# Patient Record
Sex: Female | Born: 1985 | Race: Black or African American | Hispanic: No | Marital: Single | Smoking: Current every day smoker
Health system: Southern US, Community
[De-identification: ages and names within clinical notes are randomized; demographics above are authoritative.]

## PROBLEM LIST (undated history)

## (undated) DIAGNOSIS — R52 Pain, unspecified: Secondary | ICD-10-CM

## (undated) DIAGNOSIS — F419 Anxiety disorder, unspecified: Secondary | ICD-10-CM

## (undated) DIAGNOSIS — M1990 Unspecified osteoarthritis, unspecified site: Secondary | ICD-10-CM

## (undated) DIAGNOSIS — R7303 Prediabetes: Secondary | ICD-10-CM

## (undated) DIAGNOSIS — K589 Irritable bowel syndrome without diarrhea: Secondary | ICD-10-CM

## (undated) DIAGNOSIS — K219 Gastro-esophageal reflux disease without esophagitis: Secondary | ICD-10-CM

## (undated) DIAGNOSIS — E010 Iodine-deficiency related diffuse (endemic) goiter: Secondary | ICD-10-CM

## (undated) DIAGNOSIS — E282 Polycystic ovarian syndrome: Secondary | ICD-10-CM

## (undated) DIAGNOSIS — S0300XA Dislocation of jaw, unspecified side, initial encounter: Secondary | ICD-10-CM

## (undated) HISTORY — PX: OSTEOTOMY FEMUR / SHAFT / SUPRACONDYLAR W/ FIXATION: SUR977

## (undated) HISTORY — PX: FOOT SURGERY: SHX648

## (undated) HISTORY — PX: BRONCHOSCOPY: SUR163

## (undated) HISTORY — PX: TUBAL LIGATION: SHX77

## (undated) HISTORY — PX: COLONOSCOPY: SHX174

---
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
## 2007-03-10 ENCOUNTER — Emergency Department: Payer: Self-pay | Admitting: Emergency Medicine

## 2007-03-10 ENCOUNTER — Other Ambulatory Visit: Payer: Self-pay

## 2007-08-02 ENCOUNTER — Observation Stay: Payer: Self-pay | Admitting: Obstetrics & Gynecology

## 2007-09-06 ENCOUNTER — Observation Stay: Payer: Self-pay | Admitting: Obstetrics & Gynecology

## 2007-09-16 ENCOUNTER — Ambulatory Visit: Payer: Self-pay | Admitting: Unknown Physician Specialty

## 2007-09-17 ENCOUNTER — Inpatient Hospital Stay: Payer: Self-pay | Admitting: Unknown Physician Specialty

## 2010-10-03 ENCOUNTER — Emergency Department: Payer: Self-pay | Admitting: Emergency Medicine

## 2011-08-28 ENCOUNTER — Ambulatory Visit: Payer: Self-pay

## 2012-02-09 ENCOUNTER — Emergency Department: Payer: Self-pay | Admitting: Emergency Medicine

## 2012-10-15 ENCOUNTER — Emergency Department: Payer: Self-pay | Admitting: Emergency Medicine

## 2013-08-02 ENCOUNTER — Ambulatory Visit: Payer: Self-pay | Admitting: Family Medicine

## 2013-09-06 ENCOUNTER — Ambulatory Visit: Payer: Self-pay | Admitting: Podiatry

## 2013-09-06 LAB — CBC WITH DIFFERENTIAL/PLATELET
BASOS ABS: 0 10*3/uL (ref 0.0–0.1)
BASOS PCT: 0.4 %
Eosinophil #: 0.2 10*3/uL (ref 0.0–0.7)
Eosinophil %: 2.3 %
HCT: 39.3 % (ref 35.0–47.0)
HGB: 13 g/dL (ref 12.0–16.0)
Lymphocyte #: 2.5 10*3/uL (ref 1.0–3.6)
Lymphocyte %: 30.4 %
MCH: 30 pg (ref 26.0–34.0)
MCHC: 33 g/dL (ref 32.0–36.0)
MCV: 91 fL (ref 80–100)
MONO ABS: 0.4 x10 3/mm (ref 0.2–0.9)
Monocyte %: 5.4 %
NEUTROS ABS: 5 10*3/uL (ref 1.4–6.5)
Neutrophil %: 61.5 %
Platelet: 213 10*3/uL (ref 150–440)
RBC: 4.32 10*6/uL (ref 3.80–5.20)
RDW: 13 % (ref 11.5–14.5)
WBC: 8.1 10*3/uL (ref 3.6–11.0)

## 2013-09-10 ENCOUNTER — Ambulatory Visit: Payer: Self-pay | Admitting: Podiatry

## 2014-09-10 NOTE — Op Note (Signed)
PATIENT NAME:  Susan Clay, Susan Clay MR#:  [id number] DATE OF BIRTH:  1985/10/07  DATE OF PROCEDURE:  09/10/2013  PREOPERATIVE DIAGNOSIS: Hallux valgus deformity, left foot.   PROCEDURE: Hallux valgus deformity, left foot.  PROCEDURE: Hallux valgus correction, Austin osteotomy,  left foot.   SURGEON: Linus Galas, DPM.   ANESTHESIA: LMA.   HEMOSTASIS: Pneumatic tourniquet, left ankle, 250 mmHg.   ESTIMATED BLOOD LOSS: Minimal.   MATERIALS: One 2.2 Medartis cannulated screw, 22 mm in length.   PATHOLOGY: None.   COMPLICATIONS: None apparent.   OPERATIVE INDICATIONS: This is a 29 year old female with a chronic history of bunion pain in both feet. The patient elects to have surgical correction starting with the left.   OPERATIVE PROCEDURE: The patient was taken to the operating room and placed on the table in the supine position. Following satisfactory LMA anesthesia, the left foot was anesthetized with 10 mL of 0.5% Sensorcaine plain around the first metatarsal. A pneumatic tourniquet was applied at the level of the left ankle and the foot was prepped and draped in the usual sterile fashion. The foot was exsanguinated and the tourniquet inflated to 250 mmHg.   Attention was then directed to the dorsal aspect of the left foot where an approximate 5 cm linear incision was made coursing proximal to distal centered over the first metatarsal and metatarsophalangeal joint. The incision was deepened via sharp and blunt dissection down to the level of the capsule where a linear capsulotomy was performed. Capsular and periosteal tissues were reflected off of the medial and dorsal aspect of the first metatarsal. Using a pneumatic saw, the medial eminence was resected along with the dorsal prominence. A 0.045 inch K wire was then driven from medial to lateral as an axis guide to the metatarsal head and a V-shaped osteotomy was performed through the head of the first metatarsal. The pin was removed and the  capital fragment was freely mobilized.   Attention was then directed to the lateral aspect of the joint in the first interspace where the incision was deepened down to the level of the transverse intermetatarsal ligament, which was incised. The adductor tendon was freed off the lateral aspect of the joint and a small section was removed. A lateral capsulotomy was performed with freeing of the sesamoid apparatus. There was noted to be good release of the contracture on the great toe. Attention was then directed back medially where the capital fragment was mobilized laterally and fixated using a K wire from the Medartis screw set. Intraoperative FluoroScan views revealed good reduction of the deformity. Next, the hole was drilled using standard technique and a 22 mm 2.2 Medartis cannulated screw was then inserted. Excellent compression and stability at the osteotomy was noted. The redundant medial eminence was then resected using a pneumatic saw and the edges were rasped smooth. Intraoperative FluoroScan views revealed good reduction of the deformity and screw placement. The wound was then flushed with copious amounts of sterile saline and closed using 4-0 Vicryl running suture for all layers from capsular and periosteal closure to deep and superficial subcutaneous and skin closure. Tincture of benzoin and Steri-Strips were applied followed by a sterile bandage. A plaster posterior splint was then applied with the foot 90 degrees relative to the leg. The patient tolerated the procedure and anesthesia well and was transported to the PACU with vital signs stable and in good condition.   ____________________________ Linus Galas, DPM tc:aw D: 09/10/2013 09:38:52 ET T: 09/10/2013 10:27:15 ET JOB#: [id number]  cc: Linus Galasodd Jennise Both, DPM, <Dictator> Lashanna Angelo DPM ELECTRONICALLY SIGNED 09/28/2013 15:58

## 2017-10-03 ENCOUNTER — Emergency Department: Payer: 59

## 2017-10-03 ENCOUNTER — Emergency Department
Admission: EM | Admit: 2017-10-03 | Discharge: 2017-10-03 | Disposition: A | Discharge status: Home / Self Care | Payer: 59 | Attending: Emergency Medicine | Admitting: Emergency Medicine

## 2017-10-03 DIAGNOSIS — Z79899 Other long term (current) drug therapy: Secondary | ICD-10-CM | POA: Diagnosis not present

## 2017-10-03 DIAGNOSIS — K8020 Calculus of gallbladder without cholecystitis without obstruction: Secondary | ICD-10-CM | POA: Insufficient documentation

## 2017-10-03 DIAGNOSIS — R1011 Right upper quadrant pain: Secondary | ICD-10-CM | POA: Insufficient documentation

## 2017-10-03 DIAGNOSIS — M545 Low back pain: Secondary | ICD-10-CM | POA: Diagnosis present

## 2017-10-03 DIAGNOSIS — R52 Pain, unspecified: Secondary | ICD-10-CM

## 2017-10-03 DIAGNOSIS — F17200 Nicotine dependence, unspecified, uncomplicated: Secondary | ICD-10-CM | POA: Diagnosis not present

## 2017-10-03 DIAGNOSIS — K8050 Calculus of bile duct without cholangitis or cholecystitis without obstruction: Secondary | ICD-10-CM

## 2017-10-03 LAB — BASIC METABOLIC PANEL
ANION GAP: 8 (ref 5–15)
BUN: 19 mg/dL (ref 6–20)
CALCIUM: 9.1 mg/dL (ref 8.9–10.3)
CO2: 24 mmol/L (ref 22–32)
Chloride: 103 mmol/L (ref 101–111)
Creatinine, Ser: 0.81 mg/dL (ref 0.44–1.00)
GFR calc Af Amer: >60 mL/min (ref >60)
Glucose, Bld: 158 mg/dL — ABNORMAL HIGH (ref 65–99)
Potassium: 3.4 mmol/L — ABNORMAL LOW (ref 3.5–5.1)
Sodium: 135 mmol/L (ref 135–145)

## 2017-10-03 LAB — CBC
HCT: 36.6 % (ref 35.0–47.0)
HEMOGLOBIN: 12.7 g/dL (ref 12.0–16.0)
MCH: 31.2 pg (ref 26.0–34.0)
MCHC: 34.7 g/dL (ref 32.0–36.0)
MCV: 89.9 fL (ref 80.0–100.0)
Platelets: 268 10*3/uL (ref 150–440)
RBC: 4.07 MIL/uL (ref 3.80–5.20)
RDW: 13.2 % (ref 11.5–14.5)
WBC: 14.8 10*3/uL — ABNORMAL HIGH (ref 3.6–11.0)

## 2017-10-03 LAB — HEPATIC FUNCTION PANEL
ALK PHOS: 77 U/L (ref 38–126)
ALT: 16 U/L (ref 14–54)
AST: 34 U/L (ref 15–41)
Albumin: 4 g/dL (ref 3.5–5.0)
BILIRUBIN TOTAL: 0.4 mg/dL (ref 0.3–1.2)
Total Protein: 7.6 g/dL (ref 6.5–8.1)

## 2017-10-03 LAB — TROPONIN I

## 2017-10-03 LAB — HCG, QUANTITATIVE, PREGNANCY

## 2017-10-03 LAB — LIPASE, BLOOD: Lipase: 31 U/L (ref 11–51)

## 2017-10-03 MED ORDER — HYDROCODONE-ACETAMINOPHEN 5-325 MG PO TABS: 1.0000 | ORAL_TABLET | Freq: Four times a day (QID) | ORAL | 0 refills | Status: DC | PRN

## 2017-10-03 MED ORDER — ACYCLOVIR 200 MG PO CAPS
800.0000 mg | ORAL_CAPSULE | Freq: Once | ORAL | Status: AC
  Administered 2017-10-03: 800 mg via ORAL
  Filled 2017-10-03: qty 4

## 2017-10-03 MED ORDER — ONDANSETRON HCL 4 MG/2ML IJ SOLN
4.0000 mg | Freq: Once | INTRAMUSCULAR | Status: AC
  Administered 2017-10-03: 4 mg via INTRAVENOUS
  Filled 2017-10-03: qty 2

## 2017-10-03 MED ORDER — MORPHINE SULFATE (PF) 4 MG/ML IV SOLN
4.0000 mg | Freq: Once | INTRAVENOUS | Status: AC
  Administered 2017-10-03: 4 mg via INTRAVENOUS
  Filled 2017-10-03: qty 1

## 2017-10-03 MED ORDER — SODIUM CHLORIDE 0.9 % IV BOLUS
1000.0000 mL | Freq: Once | INTRAVENOUS | Status: AC
  Administered 2017-10-03: 1000 mL via INTRAVENOUS

## 2017-10-03 MED ORDER — ONDANSETRON 4 MG PO TBDP: 4.0000 mg | ORAL_TABLET | Freq: Three times a day (TID) | ORAL | 0 refills | Status: DC | PRN

## 2017-10-03 MED ORDER — KETOROLAC TROMETHAMINE 30 MG/ML IJ SOLN
15.0000 mg | Freq: Once | INTRAMUSCULAR | Status: AC
  Administered 2017-10-03: 15 mg via INTRAVENOUS
  Filled 2017-10-03: qty 1

## 2017-10-03 MED ORDER — IBUPROFEN 600 MG PO TABS: 600.0000 mg | ORAL_TABLET | Freq: Three times a day (TID) | ORAL | 0 refills | Status: DC | PRN

## 2017-10-03 NOTE — Discharge Instructions (Signed)
It was a pleasure to take care of you today, and thank you for coming to our emergency department.  If you have any questions or concerns before leaving please ask the nurse to grab me and I'm more than happy to go through your aftercare instructions again.  If you were prescribed any opioid pain medication today such as Norco, Vicodin, Percocet, morphine, hydrocodone, or oxycodone please make sure you do not drive when you are taking this medication as it can alter your ability to drive safely.  If you have any concerns once you are home that you are not improving or are in fact getting worse before you can make it to your follow-up appointment, please do not hesitate to call [phone] and come back for further evaluation.  Merrily Brittle, MD  Results for orders placed or performed during the hospital encounter of 10/03/17  Basic metabolic panel  Result Value Ref Range   Sodium 135 135 - 145 mmol/L   Potassium 3.4 (L) 3.5 - 5.1 mmol/L   Chloride 103 101 - 111 mmol/L   CO2 24 22 - 32 mmol/L   Glucose, Bld 158 (H) 65 - 99 mg/dL   BUN 19 6 - 20 mg/dL   Creatinine, Ser 6.96 0.44 - 1.00 mg/dL   Calcium 9.1 8.9 - 29.5 mg/dL   GFR calc non Af Amer >60 >60 mL/min   GFR calc Af Amer >60 >60 mL/min   Anion gap 8 5 - 15  CBC  Result Value Ref Range   WBC 14.8 (H) 3.6 - 11.0 K/uL   RBC 4.07 3.80 - 5.20 MIL/uL   Hemoglobin 12.7 12.0 - 16.0 g/dL   HCT 28.4 13.2 - 44.0 %   MCV 89.9 80.0 - 100.0 fL   MCH 31.2 26.0 - 34.0 pg   MCHC 34.7 32.0 - 36.0 g/dL   RDW 10.2 72.5 - 36.6 %   Platelets 268 150 - 440 K/uL  Troponin I  Result Value Ref Range   Troponin I <0.03 <0.03 ng/mL  hCG, quantitative, pregnancy  Result Value Ref Range   hCG, Beta Chain, Quant, S <1 <5 mIU/mL  Lipase, blood  Result Value Ref Range   Lipase 31 11 - 51 U/L  Hepatic function panel  Result Value Ref Range   Total Protein 7.6 6.5 - 8.1 g/dL   Albumin 4.0 3.5 - 5.0 g/dL   AST 34 15 - 41 U/L   ALT 16 14 - 54 U/L   Alkaline  Phosphatase 77 38 - 126 U/L   Total Bilirubin 0.4 0.3 - 1.2 mg/dL   Bilirubin, Direct <4.4 (L) 0.1 - 0.5 mg/dL   Indirect Bilirubin NOT CALCULATED 0.3 - 0.9 mg/dL   Dg Chest 2 View  Result Date: 10/03/2017 CLINICAL DATA:  Mid back pain EXAM: CHEST - 2 VIEW COMPARISON:  None. FINDINGS: The heart size and mediastinal contours are within normal limits. Both lungs are clear. The visualized skeletal structures are unremarkable. IMPRESSION: No active cardiopulmonary disease. Electronically Signed   By: Jasmine Pang M.D.   On: 10/03/2017 02:49   US Abdomen Limited Ruq  Result Date: 10/03/2017 CLINICAL DATA:  Acute onset of right upper quadrant abdominal pain, radiating to the back. EXAM: ULTRASOUND ABDOMEN LIMITED RIGHT UPPER QUADRANT COMPARISON:  None. FINDINGS: Gallbladder: Mobile stones are seen within the gallbladder, measuring up to 8 mm in size. No significant gallbladder wall thickening or pericholecystic fluid is seen. No ultrasonographic Murphy's sign elicited. Common bile duct: Diameter: 0.3 cm,  within normal limits in caliber. Liver: No focal lesion identified. Within normal limits in parenchymal echogenicity. Portal vein is patent on color Doppler imaging with normal direction of blood flow towards the liver. IMPRESSION: 1. No acute abnormality seen at the right upper quadrant. 2. Cholelithiasis. Gallbladder otherwise unremarkable in appearance. Electronically Signed   By: Roanna Raider M.D.   On: 10/03/2017 05:19

## 2017-10-03 NOTE — ED Notes (Signed)
Pt with severe ride sided back pain radiating around front. States it started suddenly and accompanied by nausea and vomitting. Zofran in triage gave slight relief

## 2017-10-03 NOTE — ED Provider Notes (Signed)
[facility] Emergency Department Provider Note  ____________________________________________   None    (approximate)  I have reviewed the triage vital signs and the nursing notes.   HISTORY  Chief Complaint Back Pain   HPI Susan Clay is a 32 y.o. female who comes to the emergency department via EMS with sudden onset severe right upper quadrant and right back pain that occurred while she was in bed.  Is associated with nausea but no vomiting.  No dysuria frequency or hesitancy.  No fevers or chills.  She has no history of abdominal surgeries.  The symptoms are severe and seem to be intermittent.  Somewhat worse when trying to eat.  No history of IV drug use steroids or tuberculosis.  The pain is sharp and severe radiating around from her back towards her right upper quadrant.  It is not ripping or tearing.  History reviewed. No pertinent past medical history.  There are no active problems to display for this patient.   History reviewed. No pertinent surgical history.  Prior to Admission medications   Medication Sig Start Date End Date Taking? Authorizing Provider  HYDROcodone-acetaminophen (NORCO) 5-325 MG tablet Take 1 tablet by mouth every 6 (six) hours as needed for up to 15 doses for severe pain. 10/03/17   Merrily Brittle, MD  ibuprofen (ADVIL,MOTRIN) 600 MG tablet Take 1 tablet (600 mg total) by mouth every 8 (eight) hours as needed. 10/03/17   Merrily Brittle, MD  ondansetron (ZOFRAN ODT) 4 MG disintegrating tablet Take 1 tablet (4 mg total) by mouth every 8 (eight) hours as needed for nausea or vomiting. 10/03/17   Merrily Brittle, MD    Allergies Phenylephrine hcl  No family history on file.  Social History Social History   Tobacco Use  . Smoking status: Current Every Day Smoker  . Smokeless tobacco: Never Used  Substance Use Topics  . Alcohol use: Yes  . Drug use: Not on file    Review of Systems Constitutional: No  fever/chills Eyes: No visual changes. ENT: No sore throat. Cardiovascular: Denies chest pain. Respiratory: Denies shortness of breath. Gastrointestinal: Positive for abdominal pain.  Positive for nausea, no vomiting.  No diarrhea.  No constipation. Genitourinary: Negative for dysuria. Musculoskeletal: Positive for back pain. Skin: Negative for rash. Neurological: Negative for headaches, focal weakness or numbness.   ____________________________________________   PHYSICAL EXAM:  VITAL SIGNS: ED Triage Vitals  Enc Vitals Group     BP 10/03/17 0209 109/75     Pulse Rate 10/03/17 0209 71     Resp 10/03/17 0209 19     Temp 10/03/17 0209 97.6 F (36.4 C)     Temp Source 10/03/17 0209 Oral     SpO2 10/03/17 0209 99 %     Weight 10/03/17 0210 210 lb (95.3 kg)     Height 10/03/17 0210  (1.6 m)     Head Circumference --      Peak Flow --      Pain Score 10/03/17 0209 10     Pain Loc --      Pain Edu? --      Excl. in GC? --     Constitutional: Alert and oriented x4 lying on her left side crying in pain Eyes: PERRL EOMI. Head: Atraumatic. Nose: No congestion/rhinnorhea. Mouth/Throat: No trismus Neck: No stridor.   Cardiovascular: Normal rate, regular rhythm. Grossly normal heart sounds.  Good peripheral circulation. Respiratory: Normal respiratory effort.  No retractions. Lungs CTAB and moving  good air Gastrointestinal: Obese soft nondistended she is somewhat tender in right upper quadrant although with negative Murphy's no rebound or guarding no peritonitis no McBurney's tenderness no costovertebral tenderness Musculoskeletal: No lower extremity edema   Neurologic:  Normal speech and language. No gross focal neurologic deficits are appreciated. Skin:  Skin is warm, dry and intact. No rash noted. Psychiatric: Mood and affect are normal. Speech and behavior are normal.    ____________________________________________   DIFFERENTIAL includes but not limited  to  Epidural abscess, pancreatitis, biliary colic, cholecystitis, cholangitis ____________________________________________   LABS (all labs ordered are listed, but only abnormal results are displayed)  Labs Reviewed  BASIC METABOLIC PANEL - Abnormal; Notable for the following components:      Result Value   Potassium 3.4 (*)    Glucose, Bld 158 (*)    All other components within normal limits  CBC - Abnormal; Notable for the following components:   WBC 14.8 (*)    All other components within normal limits  HEPATIC FUNCTION PANEL - Abnormal; Notable for the following components:   Bilirubin, Direct <0.1 (*)    All other components within normal limits  TROPONIN I  HCG, QUANTITATIVE, PREGNANCY  LIPASE, BLOOD  URINALYSIS, COMPLETE (UACMP) WITH MICROSCOPIC    Lab work reviewed by me with elevated white count which is nonspecific __________________________________________  EKG  ED ECG REPORT I, Merrily Brittle, the attending physician, personally viewed and interpreted this ECG.  Date: 10/03/2017 EKG Time:  Rate: 72 Rhythm: normal sinus rhythm QRS Axis: normal Intervals: normal ST/T Wave abnormalities: normal Narrative Interpretation: no evidence of acute ischemia  ____________________________________________  RADIOLOGY  Right upper quadrant ultrasound reviewed by me consistent with biliary colic and no evidence of obstruction or cholecystitis Chest x-ray reviewed by me with no acute disease ____________________________________________   PROCEDURES  Procedure(s) performed: no  Procedures  Critical Care performed: no  Observation: no ____________________________________________   INITIAL IMPRESSION / ASSESSMENT AND PLAN / ED COURSE  Pertinent labs & imaging results that were available during my care of the patient were reviewed by me and considered in my medical decision making (see chart for details).  The patient arrives quite uncomfortable appearing  with right back and right upper quadrant pain.  It is in a single band and although there is no rash do have some concern for shingles.  I will cover her with acyclovir in addition to Toradol and morphine while right upper quadrant ultrasound is pending.  ----------------------------------------- 5:49 AM on 10/03/2017 -----------------------------------------  The patient's pain is nearly completely resolved and she is able to keep food down.  Right upper quadrant ultrasound is consistent with gallstone with no evidence of cholecystitis.  I had a lengthy discussion with the patient regarding importance of follow-up with general surgery to consider outpatient cholecystectomy.  At this point the patient is medically stable for outpatient management verbalizes understanding and agreement the plan.      ____________________________________________   FINAL CLINICAL IMPRESSION(S) / ED DIAGNOSES  Final diagnoses:  Pain  Biliary colic      NEW MEDICATIONS STARTED DURING THIS VISIT:  New Prescriptions   HYDROCODONE-ACETAMINOPHEN (NORCO) 5-325 MG TABLET    Take 1 tablet by mouth every 6 (six) hours as needed for up to 15 doses for severe pain.   IBUPROFEN (ADVIL,MOTRIN) 600 MG TABLET    Take 1 tablet (600 mg total) by mouth every 8 (eight) hours as needed.   ONDANSETRON (ZOFRAN ODT) 4 MG DISINTEGRATING TABLET  Take 1 tablet (4 mg total) by mouth every 8 (eight) hours as needed for nausea or vomiting.     Note:  This document was prepared using Dragon voice recognition software and may include unintentional dictation errors.     Merrily Brittle, MD 10/03/17 [phone]

## 2017-10-03 NOTE — ED Triage Notes (Signed)
Patient coming EMS from home, with sudden onset of pain across mid back. Patient denies strenuous activity. Patient describes pain as squeezing.EMS vitals:  NSR, 124/78, HR 82, 98% on RA. Patient has not taken any pain medications.

## 2017-10-06 ENCOUNTER — Ambulatory Visit: Payer: Self-pay | Admitting: General Surgery

## 2017-10-06 NOTE — H&P (Signed)
PATIENT PROFILE: Susan Clay is a 32 y.o. female who presents to the Clinic for consultation at the request of Dr. Seymour Bars for evaluation of cholelithiasis.  PCP:  Loletha Carrow, PA  HISTORY OF PRESENT ILLNESS: Ms. Lenger reports had an episode of abdominal pain 3 days ago. The pain was severe and started on the right back and radiated to the right upper quadrant. Pain was improved with morphine in the ER. Patient still has had less severe pain that has localized only to the right upper abdomen. Pain is not associated with food intake. Pain is also improved with leaning forward and aggravated leaning backward. Denies fever. Refers chills during the bad episode.    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 Positive for cold  Respiratory ROS: negative for - cough, shortness of breath or wheezing Cardiovascular ROS: no chest pain or palpitations GI ROS: negative for nausea, vomiting, abdominal pain, diarrhea. Positive for  constipation and heartburn Musculoskeletal ROS: positive for - joint swelling or muscle pain Neurological ROS: negative for - confusion, syncope Dermatological ROS: positive for itching and hives Psychiatric: negative for anxiety, depression, difficulty sleeping and memory loss  MEDICATIONS: CurrentMedications        Current Outpatient Medications  Medication Sig Dispense Refill  . HYDROcodone-acetaminophen (NORCO) 5-325 mg tablet TK 1 T PO Q 6 H PRF SEVERE PAIN  0  . hydrOXYzine (ATARAX) 25 MG tablet Take 1 tablet (25 mg total) by mouth 3 (three) times daily as needed for Itching 30 tablet 1  . IBU 600 mg tablet TK 1 T PO Q 8 H PRN  0  . ondansetron (ZOFRAN-ODT) 4 MG disintegrating tablet TAKE ONE TABLET BY MOUTH EVERY EIGHT HOURS AS NEEDED FOR NAUSEA OR VOMITING  0  . pantoprazole (PROTONIX) 40 MG DR tablet TAKE 1 TABLET BY MOUTH ONCE DAILY 90 tablet 1  . metFORMIN (GLUCOPHAGE-XR) 500 MG XR tablet Take 1 tablet (500 mg total) by mouth 2 (two) times daily. (Patient not taking: Reported on 10/06/2017 ) 60 tablet 5  . spironolactone (ALDACTONE) 50 MG tablet Take 1 tablet (50 mg total) by mouth 2 (two) times daily (Patient not taking: Reported on 10/06/2017 ) 60 tablet 5   No current facility-administered medications for this visit.       ALLERGIES: Phenylephrine  PAST MEDICAL HISTORY:     Past Medical History:  Diagnosis Date  . Allergic state   . Arthritis   . Cigarette smoker   .  Constipation   . GERD (gastroesophageal reflux disease)   . IBS (irritable bowel syndrome)   . Osteoporosis, post-menopausal   . Other nonspecific abnormal finding    ACHD age 54 normal since then  . PCOS (polycystic ovarian syndrome)   . Thyromegaly   . 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 bunion left foot  2015  . CESAREAN SECTION  2005  . CESAREAN SECTION  2009   . OSTEOTOMY FEMORAL SHAFT/SUPRACONDYLAR    . SURVEILLANCE COLONOSCOPY N/A 02/11/2014   Procedure: SURVEILLANCE COLONOSCOPY;  Surgeon: Saintclair Halsted, MD;  Location[facility] ENDO[facility];  Service: Gastroenterology;  Laterality: N/A;  . TUBAL LIGATION  2009     FAMILY HISTORY:      Family History  Problem Relation Age of Onset  . Rickets Mother   . Pancreatic cancer Mother   . Diabetes type II Mother   . Rickets Brother   . No Known Problems Father   . Diabetes type II Maternal Grandmother   . Diabetes type II Paternal Grandmother   . Prostate cancer Maternal Grandfather   . No Known Problems Paternal Grandfather   . Rickets Son   . ADD / ADHD Son   . High blood pressure (Hypertension) Neg Hx   . Breast cancer Neg Hx   . Colon cancer Neg Hx      SOCIAL HISTORY: Social History          Socioeconomic History  . Marital status: Single    Spouse name: Not on file  . Number of children: 2  . Years of education: Not on file  . Highest education level: Not on file  Occupational History  . Not on file  Social Needs  . Financial resource strain: Not on file  . Food insecurity:    Worry: Not on file    Inability: Not on file  . Transportation needs:    Medical: Not on file    Non-medical: Not on file  Tobacco Use  . Smoking status: Current Every Day Smoker    Packs/day: 0.50    Types: Cigarettes  . Smokeless tobacco: Never Used  Substance and Sexual Activity  . Alcohol use: Yes  . Drug use: No    Comment: marijuana in the past  . Sexual activity: Yes    Partners: Male    Birth control/protection: Injection    Comment: tubal ligation  Other Topics Concern  . Would you please tell us about the people who live in your home, your pets, or anything else important to your social life? 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    In a relationship       PHYSICAL EXAM:    Vitals:   10/06/17 1556  BP: 122/83  Pulse: (!) 113  Temp: 36.9 C (98.4 F)   Body mass index is 36.95 kg/m. Weight: 91.6 kg (202 lb)   GENERAL: Alert, active, oriented x3  HEENT: Pupils equal reactive to light. Extraocular movements are intact. Sclera clear. Palpebral conjunctiva normal red color.Pharynx clear.  NECK: Supple with no palpable mass and no adenopathy.  LUNGS: Sound clear with no rales rhonchi or wheezes.  HEART: Regular rhythm S1 and S2 without murmur.  ABDOMEN: Soft and depressible, nontender with no palpable mass, no hepatomegaly.   EXTREMITIES: Well-developed well-nourished symmetrical with no dependent  edema.  NEUROLOGICAL: Awake alert oriented, facial expression symmetrical, moving all extremities.  REVIEW OF DATA: I have reviewed the following data today:      Appointment on 08/05/2017  Component Date Value  . Influenza A Antigen 08/05/2017 Negative   . Influenza B Antigen 08/05/2017 Negative      ASSESSMENT: Ms. Aerts is a 32 y.o. female presenting for consultation for cholelithiasis.    Patient was oriented about the diagnosis of cholelithiasis. Also oriented about what is the gallbladder, its anatomy and function and the implications of having stones. The patient was oriented about the treatment alternatives (observation vs cholecystectomy). Patient was oriented that a low percentage of patient will continue to have similar pain symptoms even after the gallbladder is removed. Surgical technique (open vs laparoscopic) was discussed. It was also discussed the goals of the surgery (decrease the pain episodes and avoid the risk of cholecystitis) and the risk of surgery including: bleeding, infection, common bile duct injury, stone retention, injury to other organs such as bowel, liver, stomach, other complications such as hernia, bowel obstruction among others. Also discussed with patient about anesthesia and its  complications such as: reaction to medications, pneumonia, heart complications, death, among others.  PLAN: 1. Laparoscopic cholecystectomy (16109) 2. CBC, CMP 3. Do not take aspirin 5 days before surgery  Patient verbalized understanding, all questions were answered, and were agreeable with the plan outlined above.   Carolan Shiver, MD  Electronically signed by Carolan Shiver, MD

## 2017-10-06 NOTE — H&P (View-Only) (Signed)
PATIENT PROFILE: Kerryann Abaya is a 32 y.o. female who presents to the Clinic for consultation at the request of Dr. Self for evaluation of cholelithiasis.  PCP:  McLaughlin, Miriam Klem, PA  HISTORY OF PRESENT ILLNESS: Ms. Yoshida reports had an episode of abdominal pain 3 days ago. The pain was severe and started on the right back and radiated to the right upper quadrant. Pain was improved with morphine in the ER. Patient still has had less severe pain that has localized only to the right upper abdomen. Pain is not associated with food intake. Pain is also improved with leaning forward and aggravated leaning backward. Denies fever. Refers chills during the bad episode.    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 Positive for cold  Respiratory ROS: negative for - cough, shortness of breath or wheezing Cardiovascular ROS: no chest pain or palpitations GI ROS: negative for nausea, vomiting, abdominal pain, diarrhea. Positive for  constipation and heartburn Musculoskeletal ROS: positive for - joint swelling or muscle pain Neurological ROS: negative for - confusion, syncope Dermatological ROS: positive for itching and hives Psychiatric: negative for anxiety, depression, difficulty sleeping and memory loss  MEDICATIONS: CurrentMedications        Current Outpatient Medications  Medication Sig Dispense Refill  . HYDROcodone-acetaminophen (NORCO) 5-325 mg tablet TK 1 T PO Q 6 H PRF SEVERE PAIN  0  . hydrOXYzine (ATARAX) 25 MG tablet Take 1 tablet (25 mg total) by mouth 3 (three) times daily as needed for Itching 30 tablet 1  . IBU 600 mg tablet TK 1 T PO Q 8 H PRN  0  . ondansetron (ZOFRAN-ODT) 4 MG disintegrating tablet TAKE ONE TABLET BY MOUTH EVERY EIGHT HOURS AS NEEDED FOR NAUSEA OR VOMITING  0  . pantoprazole (PROTONIX) 40 MG DR tablet TAKE 1 TABLET BY MOUTH ONCE DAILY 90 tablet 1  . metFORMIN (GLUCOPHAGE-XR) 500 MG XR tablet Take 1 tablet (500 mg total) by mouth 2 (two) times daily. (Patient not taking: Reported on 10/06/2017 ) 60 tablet 5  . spironolactone (ALDACTONE) 50 MG tablet Take 1 tablet (50 mg total) by mouth 2 (two) times daily (Patient not taking: Reported on 10/06/2017 ) 60 tablet 5   No current facility-administered medications for this visit.       ALLERGIES: Phenylephrine  PAST MEDICAL HISTORY:     Past Medical History:  Diagnosis Date  . Allergic state   . Arthritis   . Cigarette smoker   .   Constipation   . GERD (gastroesophageal reflux disease)   . IBS (irritable bowel syndrome)   . Osteoporosis, post-menopausal   . Other nonspecific abnormal finding    ACHD age 21 normal since then  . PCOS (polycystic ovarian syndrome)   . Thyromegaly   . 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 bunion left foot  2015  . CESAREAN SECTION  2005  . CESAREAN SECTION  2009   . OSTEOTOMY FEMORAL SHAFT/SUPRACONDYLAR    . SURVEILLANCE COLONOSCOPY N/A 02/11/2014   Procedure: SURVEILLANCE COLONOSCOPY;  Surgeon: Daniel Murray Wild, MD;  Location: [facility];  Service: Gastroenterology;  Laterality: N/A;  . TUBAL LIGATION  2009     FAMILY HISTORY:      Family History  Problem Relation Age of Onset  . Rickets Mother   . Pancreatic cancer Mother   . Diabetes type II Mother   . Rickets Brother   . No Known Problems Father   . Diabetes type II Maternal Grandmother   . Diabetes type II Paternal Grandmother   . Prostate cancer Maternal Grandfather   . No Known Problems Paternal Grandfather   . Rickets Son   . ADD / ADHD Son   . High blood pressure (Hypertension) Neg Hx   . Breast cancer Neg Hx   . Colon cancer Neg Hx      SOCIAL HISTORY: Social History          Socioeconomic History  . Marital status: Single    Spouse name: Not on file  . Number of children: 2  . Years of education: Not on file  . Highest education level: Not on file  Occupational History  . Not on file  Social Needs  . Financial resource strain: Not on file  . Food insecurity:    Worry: Not on file    Inability: Not on file  . Transportation needs:    Medical: Not on file    Non-medical: Not on file  Tobacco Use  . Smoking status: Current Every Day Smoker    Packs/day: 0.50    Types: Cigarettes  . Smokeless tobacco: Never Used  Substance and Sexual Activity  . Alcohol use: Yes  . Drug use: No    Comment: marijuana in the past  . Sexual activity: Yes    Partners: Male    Birth control/protection: Injection    Comment: tubal ligation  Other Topics Concern  . Would you please tell us about the people who live in your home, your pets, or anything else important to your social life? 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 Currently at [employer], C[employer] program.    In a relationship       PHYSICAL EXAM:    Vitals:   10/06/17 1556  BP: 122/83  Pulse: (!) 113  Temp: 36.9 C (98.4 F)   Body mass index is 36.95 kg/m. Weight: 91.6 kg (202 lb)   GENERAL: Alert, active, oriented x3  HEENT: Pupils equal reactive to light. Extraocular movements are intact. Sclera clear. Palpebral conjunctiva normal red color.Pharynx clear.  NECK: Supple with no palpable mass and no adenopathy.  LUNGS: Sound clear with no rales rhonchi or wheezes.  HEART: Regular rhythm S1 and S2 without murmur.  ABDOMEN: Soft and depressible, nontender with no palpable mass, no hepatomegaly.   EXTREMITIES: Well-developed well-nourished symmetrical with no dependent   edema.  NEUROLOGICAL: Awake alert oriented, facial expression symmetrical, moving all extremities.  REVIEW OF DATA: I have reviewed the following data today:      Appointment on 08/05/2017  Component Date Value  . Influenza A Antigen 08/05/2017 Negative   . Influenza B Antigen 08/05/2017 Negative      ASSESSMENT: Ms. Aerts is a 32 y.o. female presenting for consultation for cholelithiasis.    Patient was oriented about the diagnosis of cholelithiasis. Also oriented about what is the gallbladder, its anatomy and function and the implications of having stones. The patient was oriented about the treatment alternatives (observation vs cholecystectomy). Patient was oriented that a low percentage of patient will continue to have similar pain symptoms even after the gallbladder is removed. Surgical technique (open vs laparoscopic) was discussed. It was also discussed the goals of the surgery (decrease the pain episodes and avoid the risk of cholecystitis) and the risk of surgery including: bleeding, infection, common bile duct injury, stone retention, injury to other organs such as bowel, liver, stomach, other complications such as hernia, bowel obstruction among others. Also discussed with patient about anesthesia and its  complications such as: reaction to medications, pneumonia, heart complications, death, among others.  PLAN: 1. Laparoscopic cholecystectomy (16109) 2. CBC, CMP 3. Do not take aspirin 5 days before surgery  Patient verbalized understanding, all questions were answered, and were agreeable with the plan outlined above.   Carolan Shiver, MD  Electronically signed by Carolan Shiver, MD

## 2017-10-08 ENCOUNTER — Other Ambulatory Visit: Payer: Self-pay

## 2017-10-08 ENCOUNTER — Encounter
Admission: RE | Admit: 2017-10-08 | Discharge: 2017-10-08 | Disposition: A | Discharge status: Home / Self Care | Payer: 59 | Source: Ambulatory Visit | Attending: General Surgery | Admitting: General Surgery

## 2017-10-08 DIAGNOSIS — E669 Obesity, unspecified: Secondary | ICD-10-CM | POA: Diagnosis not present

## 2017-10-08 DIAGNOSIS — F17210 Nicotine dependence, cigarettes, uncomplicated: Secondary | ICD-10-CM | POA: Diagnosis not present

## 2017-10-08 DIAGNOSIS — K219 Gastro-esophageal reflux disease without esophagitis: Secondary | ICD-10-CM | POA: Diagnosis not present

## 2017-10-08 DIAGNOSIS — K812 Acute cholecystitis with chronic cholecystitis: Secondary | ICD-10-CM | POA: Diagnosis present

## 2017-10-08 DIAGNOSIS — Z6836 Body mass index (BMI) 36.0-36.9, adult: Secondary | ICD-10-CM | POA: Diagnosis not present

## 2017-10-08 DIAGNOSIS — K8012 Calculus of gallbladder with acute and chronic cholecystitis without obstruction: Secondary | ICD-10-CM | POA: Diagnosis not present

## 2017-10-08 DIAGNOSIS — K589 Irritable bowel syndrome without diarrhea: Secondary | ICD-10-CM | POA: Diagnosis not present

## 2017-10-08 DIAGNOSIS — E010 Iodine-deficiency related diffuse (endemic) goiter: Secondary | ICD-10-CM | POA: Diagnosis not present

## 2017-10-08 DIAGNOSIS — Z79899 Other long term (current) drug therapy: Secondary | ICD-10-CM | POA: Diagnosis not present

## 2017-10-08 DIAGNOSIS — E282 Polycystic ovarian syndrome: Secondary | ICD-10-CM | POA: Diagnosis not present

## 2017-10-08 HISTORY — DX: Dislocation of jaw, unspecified side, initial encounter: S03.00XA

## 2017-10-08 HISTORY — DX: Gastro-esophageal reflux disease without esophagitis: K21.9

## 2017-10-08 HISTORY — DX: Irritable bowel syndrome, unspecified: K58.9

## 2017-10-08 HISTORY — DX: Unspecified osteoarthritis, unspecified site: M19.90

## 2017-10-08 HISTORY — DX: Prediabetes: R73.03

## 2017-10-08 HISTORY — DX: Pain, unspecified: R52

## 2017-10-08 HISTORY — DX: Anxiety disorder, unspecified: F41.9

## 2017-10-08 HISTORY — DX: Polycystic ovarian syndrome: E28.2

## 2017-10-08 HISTORY — DX: Iodine-deficiency related diffuse (endemic) goiter: E01.0

## 2017-10-08 HISTORY — DX: Other disorders of phosphorus metabolism: E83.39

## 2017-10-08 LAB — PHOSPHORUS: Phosphorus: 2 mg/dL — ABNORMAL LOW (ref 2.5–4.6)

## 2017-10-08 NOTE — Patient Instructions (Signed)
Your procedure is scheduled on: 10/10/17 Report to Day Surgery. [facility]LO[facility] To find out your arrival time please call [phone] between 1PM - 3PM on  10/09/17  Remember: Instructions that are not followed completely may result in serious medical risk, up to and including death, or upon the discretion of your surgeon and anesthesiologist your surgery may need to be rescheduled.     _X__ 1. Do not eat food after midnight the night before your procedure.                 No gum chewing or hard candies. You may drink clear liquids up to 2 hours                 before you are scheduled to arrive for your surgery- DO not drink clear                 liquids within 2 hours of the start of your surgery.                 Clear Liquids include:  water, apple juice without pulp, clear carbohydrate                 drink such as Clearfast of Gartorade, Black Coffee or Tea (Do not add                 anything to coffee or tea).  __X__2.  On the morning of surgery brush your teeth with toothpaste and water, you                 may rinse your mouth with mouthwash if you wish.  Do not swallow any              toothpaste of mouthwash.     _X__ 3.  No Alcohol for 24 hours before or after surgery.   _X__ 4.  Do Not Smoke or use e-cigarettes For 24 Hours Prior to Your Surgery.                 Do not use any chewable tobacco products for at least 6 hours prior to                 surgery.  ____  5.  Bring all medications with you on the day of surgery if instructed.   _X__  6.  Notify your doctor if there is any change in your medical condition      (cold, fever, infections).     Do not wear jewelry, make-up, hairpins, clips or nail polish. Do not wear lotions, powders, or perfumes. You may wear deodorant. Do not shave 48 hours prior to surgery. Men may shave face and neck. Do not bring valuables to the hospital.    [facility] is not responsible for any belongings or  valuables.  Contacts, dentures or bridgework may not be worn into surgery. Leave your suitcase in the car. After surgery it may be brought to your room. For patients admitted to the hospital, discharge time is determined by your treatment team.   Patients discharged the day of surgery will not be allowed to drive home.   Please read over the following fact sheets that you were given:   Surgical Site Infection Prevention          ____ Take these medicines the morning of surgery with A SIP OF WATER:    1. PANTOPRAZOLE  2.   3.  4.  5.  6.  ____ Fleet Enema (as directed)   __X__ Use CHG Soap as directed  ____ Use inhalers on the day of surgery  ____ Stop metformin 2 days prior to surgery    ____ Take 1/2 of usual insulin dose the night before surgery. No insulin the morning          of surgery.   ____ Stop Coumadin/Plavix/aspirin on   _X__ Stop Anti-inflammatories on      10/08/17    ____ Stop supplements until after surgery.    ____ Bring C-Pap to the hospital.

## 2017-10-08 NOTE — Pre-Procedure Instructions (Signed)
H/P LISTS ACHD AS PROBLEM AGE 32. PATIENT DENIES ANY CARDIAC ISSUES OR PROBLEMS AGE 32. LM FOR DR CINTRON DIAZ TO FIND OUT WHAT ACHD IS.

## 2017-10-09 LAB — CALCITRIOL (1,25 DI-OH VIT D): Vit D, 1,25-Dihydroxy: 54.7 pg/mL (ref 19.9–79.3)

## 2017-10-09 MED ORDER — CEFAZOLIN SODIUM-DEXTROSE 2-4 GM/100ML-% IV SOLN
2.0000 g | INTRAVENOUS | Status: AC
  Administered 2017-10-10: 2 g via INTRAVENOUS

## 2017-10-10 ENCOUNTER — Encounter: Payer: Self-pay | Admitting: Anesthesiology

## 2017-10-10 ENCOUNTER — Ambulatory Visit: Payer: 59 | Admitting: Anesthesiology

## 2017-10-10 ENCOUNTER — Encounter
Admission: RE | Disposition: A | Discharge status: Home / Self Care | Payer: Self-pay | Source: Ambulatory Visit | Attending: General Surgery

## 2017-10-10 ENCOUNTER — Ambulatory Visit
Admission: RE | Admit: 2017-10-10 | Discharge: 2017-10-10 | Disposition: A | Discharge status: Home / Self Care | Payer: 59 | Source: Ambulatory Visit | Attending: General Surgery | Admitting: General Surgery

## 2017-10-10 ENCOUNTER — Other Ambulatory Visit: Payer: Self-pay

## 2017-10-10 DIAGNOSIS — K8012 Calculus of gallbladder with acute and chronic cholecystitis without obstruction: Secondary | ICD-10-CM | POA: Insufficient documentation

## 2017-10-10 DIAGNOSIS — K589 Irritable bowel syndrome without diarrhea: Secondary | ICD-10-CM | POA: Insufficient documentation

## 2017-10-10 DIAGNOSIS — K219 Gastro-esophageal reflux disease without esophagitis: Secondary | ICD-10-CM | POA: Insufficient documentation

## 2017-10-10 DIAGNOSIS — E669 Obesity, unspecified: Secondary | ICD-10-CM | POA: Insufficient documentation

## 2017-10-10 DIAGNOSIS — E010 Iodine-deficiency related diffuse (endemic) goiter: Secondary | ICD-10-CM | POA: Insufficient documentation

## 2017-10-10 DIAGNOSIS — E282 Polycystic ovarian syndrome: Secondary | ICD-10-CM | POA: Insufficient documentation

## 2017-10-10 DIAGNOSIS — F17210 Nicotine dependence, cigarettes, uncomplicated: Secondary | ICD-10-CM | POA: Insufficient documentation

## 2017-10-10 DIAGNOSIS — Z6836 Body mass index (BMI) 36.0-36.9, adult: Secondary | ICD-10-CM | POA: Insufficient documentation

## 2017-10-10 DIAGNOSIS — Z79899 Other long term (current) drug therapy: Secondary | ICD-10-CM | POA: Insufficient documentation

## 2017-10-10 HISTORY — PX: CHOLECYSTECTOMY: SHX55

## 2017-10-10 LAB — POCT PREGNANCY, URINE: Preg Test, Ur: NEGATIVE

## 2017-10-10 LAB — GLUCOSE, CAPILLARY: GLUCOSE-CAPILLARY: 99 mg/dL (ref 65–99)

## 2017-10-10 SURGERY — LAPAROSCOPIC CHOLECYSTECTOMY
Anesthesia: General | Wound class: Clean Contaminated

## 2017-10-10 MED ORDER — ROCURONIUM BROMIDE 50 MG/5ML IV SOLN
INTRAVENOUS | Status: AC
  Filled 2017-10-10: qty 1

## 2017-10-10 MED ORDER — ONDANSETRON HCL 4 MG/2ML IJ SOLN: 4.0000 mg | Freq: Once | INTRAMUSCULAR | Status: DC | PRN

## 2017-10-10 MED ORDER — MIDAZOLAM HCL 2 MG/2ML IJ SOLN
INTRAMUSCULAR | Status: DC | PRN
  Administered 2017-10-10: 2 mg via INTRAVENOUS

## 2017-10-10 MED ORDER — BUPIVACAINE HCL (PF) 0.5 % IJ SOLN
INTRAMUSCULAR | Status: DC | PRN
  Administered 2017-10-10: 15 mL

## 2017-10-10 MED ORDER — FENTANYL CITRATE (PF) 100 MCG/2ML IJ SOLN
INTRAMUSCULAR | Status: AC
  Administered 2017-10-10: 25 ug via INTRAVENOUS
  Filled 2017-10-10: qty 2

## 2017-10-10 MED ORDER — DEXAMETHASONE SODIUM PHOSPHATE 10 MG/ML IJ SOLN
INTRAMUSCULAR | Status: AC
  Filled 2017-10-10: qty 1

## 2017-10-10 MED ORDER — FENTANYL CITRATE (PF) 100 MCG/2ML IJ SOLN
INTRAMUSCULAR | Status: AC
  Administered 2017-10-10: 50 ug via INTRAVENOUS
  Filled 2017-10-10: qty 2

## 2017-10-10 MED ORDER — BUPIVACAINE HCL (PF) 0.5 % IJ SOLN
INTRAMUSCULAR | Status: AC
  Filled 2017-10-10: qty 30

## 2017-10-10 MED ORDER — ONDANSETRON HCL 4 MG/2ML IJ SOLN
INTRAMUSCULAR | Status: AC
  Filled 2017-10-10: qty 2

## 2017-10-10 MED ORDER — LIDOCAINE HCL (CARDIAC) PF 100 MG/5ML IV SOSY
PREFILLED_SYRINGE | INTRAVENOUS | Status: DC | PRN
  Administered 2017-10-10: 80 mg via INTRAVENOUS

## 2017-10-10 MED ORDER — SEVOFLURANE IN SOLN
RESPIRATORY_TRACT | Status: AC
  Filled 2017-10-10: qty 250

## 2017-10-10 MED ORDER — KETOROLAC TROMETHAMINE 30 MG/ML IJ SOLN
INTRAMUSCULAR | Status: AC
  Filled 2017-10-10: qty 1

## 2017-10-10 MED ORDER — FENTANYL CITRATE (PF) 100 MCG/2ML IJ SOLN
INTRAMUSCULAR | Status: DC | PRN
  Administered 2017-10-10: 25 ug via INTRAVENOUS
  Administered 2017-10-10: 100 ug via INTRAVENOUS
  Administered 2017-10-10: 50 ug via INTRAVENOUS
  Administered 2017-10-10: 100 ug via INTRAVENOUS
  Administered 2017-10-10: 50 ug via INTRAVENOUS

## 2017-10-10 MED ORDER — LACTATED RINGERS IV SOLN
INTRAVENOUS | Status: DC
  Administered 2017-10-10 (×2): via INTRAVENOUS

## 2017-10-10 MED ORDER — PROPOFOL 10 MG/ML IV BOLUS
INTRAVENOUS | Status: AC
  Filled 2017-10-10: qty 20

## 2017-10-10 MED ORDER — FENTANYL CITRATE (PF) 100 MCG/2ML IJ SOLN
50.0000 ug | Freq: Once | INTRAMUSCULAR | Status: AC
  Administered 2017-10-10: 50 ug via INTRAVENOUS

## 2017-10-10 MED ORDER — CEFAZOLIN SODIUM-DEXTROSE 2-4 GM/100ML-% IV SOLN
INTRAVENOUS | Status: AC
  Filled 2017-10-10: qty 100

## 2017-10-10 MED ORDER — DEXMEDETOMIDINE HCL 200 MCG/2ML IV SOLN
INTRAVENOUS | Status: DC | PRN
  Administered 2017-10-10 (×2): 8 ug via INTRAVENOUS

## 2017-10-10 MED ORDER — ONDANSETRON HCL 4 MG/2ML IJ SOLN
INTRAMUSCULAR | Status: DC | PRN
  Administered 2017-10-10: 4 mg via INTRAVENOUS

## 2017-10-10 MED ORDER — SUGAMMADEX SODIUM 200 MG/2ML IV SOLN
INTRAVENOUS | Status: DC | PRN
  Administered 2017-10-10: 200 mg via INTRAVENOUS

## 2017-10-10 MED ORDER — MIDAZOLAM HCL 2 MG/2ML IJ SOLN
INTRAMUSCULAR | Status: AC
  Filled 2017-10-10: qty 2

## 2017-10-10 MED ORDER — PHENYLEPHRINE HCL 10 MG/ML IJ SOLN
INTRAMUSCULAR | Status: DC | PRN
  Administered 2017-10-10: 100 ug via INTRAVENOUS

## 2017-10-10 MED ORDER — ROCURONIUM BROMIDE 100 MG/10ML IV SOLN
INTRAVENOUS | Status: DC | PRN
  Administered 2017-10-10: 50 mg via INTRAVENOUS
  Administered 2017-10-10 (×2): 10 mg via INTRAVENOUS

## 2017-10-10 MED ORDER — ACETAMINOPHEN 10 MG/ML IV SOLN
INTRAVENOUS | Status: AC
  Filled 2017-10-10: qty 100

## 2017-10-10 MED ORDER — ACETAMINOPHEN 10 MG/ML IV SOLN
INTRAVENOUS | Status: DC | PRN
  Administered 2017-10-10: 1000 mg via INTRAVENOUS

## 2017-10-10 MED ORDER — FENTANYL CITRATE (PF) 250 MCG/5ML IJ SOLN
INTRAMUSCULAR | Status: AC
  Filled 2017-10-10: qty 5

## 2017-10-10 MED ORDER — HYDROCODONE-ACETAMINOPHEN 5-325 MG PO TABS
ORAL_TABLET | ORAL | Status: AC
  Administered 2017-10-10: 1 via ORAL
  Filled 2017-10-10: qty 1

## 2017-10-10 MED ORDER — PROPOFOL 10 MG/ML IV BOLUS
INTRAVENOUS | Status: DC | PRN
  Administered 2017-10-10: 170 mg via INTRAVENOUS

## 2017-10-10 MED ORDER — SUGAMMADEX SODIUM 200 MG/2ML IV SOLN
INTRAVENOUS | Status: AC
  Filled 2017-10-10: qty 2

## 2017-10-10 MED ORDER — HYDROCODONE-ACETAMINOPHEN 5-325 MG PO TABS
1.0000 | ORAL_TABLET | Freq: Once | ORAL | Status: AC
  Administered 2017-10-10: 1 via ORAL

## 2017-10-10 MED ORDER — LIDOCAINE HCL (PF) 2 % IJ SOLN
INTRAMUSCULAR | Status: AC
  Filled 2017-10-10: qty 10

## 2017-10-10 MED ORDER — BUPIVACAINE-EPINEPHRINE (PF) 0.5% -1:200000 IJ SOLN
INTRAMUSCULAR | Status: AC
  Filled 2017-10-10: qty 30

## 2017-10-10 MED ORDER — FENTANYL CITRATE (PF) 100 MCG/2ML IJ SOLN
INTRAMUSCULAR | Status: AC
  Filled 2017-10-10: qty 2

## 2017-10-10 MED ORDER — HYDROCODONE-ACETAMINOPHEN 5-325 MG PO TABS: 1.0000 | ORAL_TABLET | ORAL | 0 refills | Status: AC | PRN

## 2017-10-10 MED ORDER — DEXAMETHASONE SODIUM PHOSPHATE 10 MG/ML IJ SOLN
INTRAMUSCULAR | Status: DC | PRN
  Administered 2017-10-10: 10 mg via INTRAVENOUS

## 2017-10-10 MED ORDER — FENTANYL CITRATE (PF) 100 MCG/2ML IJ SOLN
25.0000 ug | INTRAMUSCULAR | Status: DC | PRN
  Administered 2017-10-10 (×2): 25 ug via INTRAVENOUS

## 2017-10-10 SURGICAL SUPPLY — 40 items
APPLIER CLIP 5 13 M/L LIGAMAX5 (MISCELLANEOUS) ×3
BLADE SURG SZ11 CARB STEEL (BLADE) ×3 IMPLANT
CANISTER SUCT 1200ML W/VALVE (MISCELLANEOUS) ×3 IMPLANT
CHLORAPREP W/TINT 26ML (MISCELLANEOUS) ×3 IMPLANT
CLIP APPLIE 5 13 M/L LIGAMAX5 (MISCELLANEOUS) ×1 IMPLANT
DERMABOND ADVANCED (GAUZE/BANDAGES/DRESSINGS) ×2
DERMABOND ADVANCED .7 DNX12 (GAUZE/BANDAGES/DRESSINGS) ×1 IMPLANT
DRSG TEGADERM 2-3/8X2-3/4 SM (GAUZE/BANDAGES/DRESSINGS) ×3 IMPLANT
DRSG TELFA 4X3 1S NADH ST (GAUZE/BANDAGES/DRESSINGS) ×3 IMPLANT
ELECT REM PT RETURN 9FT ADLT (ELECTROSURGICAL) ×3
ELECTRODE REM PT RTRN 9FT ADLT (ELECTROSURGICAL) ×1 IMPLANT
GLOVE BIO SURGEON STRL SZ 6.5 (GLOVE) ×2 IMPLANT
GLOVE BIO SURGEONS STRL SZ 6.5 (GLOVE) ×1
GOWN STRL REUS W/ TWL LRG LVL3 (GOWN DISPOSABLE) ×4 IMPLANT
GOWN STRL REUS W/TWL LRG LVL3 (GOWN DISPOSABLE) ×8
GRASPER SUT TROCAR 14GX15 (MISCELLANEOUS) IMPLANT
HEMOSTAT SURGICEL 2X3 (HEMOSTASIS) IMPLANT
IRRIGATION STRYKERFLOW (MISCELLANEOUS) ×1 IMPLANT
IRRIGATOR STRYKERFLOW (MISCELLANEOUS) ×3
IV NS 1000ML (IV SOLUTION) ×2
IV NS 1000ML BAXH (IV SOLUTION) ×1 IMPLANT
KIT TURNOVER KIT A (KITS) ×3 IMPLANT
LABEL OR SOLS (LABEL) ×3 IMPLANT
NEEDLE HYPO 25X1 1.5 SAFETY (NEEDLE) ×3 IMPLANT
NEEDLE INSUFFLATION 14GA 120MM (NEEDLE) ×3 IMPLANT
NS IRRIG 500ML POUR BTL (IV SOLUTION) ×3 IMPLANT
PACK LAP CHOLECYSTECTOMY (MISCELLANEOUS) ×3 IMPLANT
PENCIL ELECTRO HAND CTR (MISCELLANEOUS) IMPLANT
POUCH SPECIMEN RETRIEVAL 10MM (ENDOMECHANICALS) ×3 IMPLANT
SCISSORS METZENBAUM CVD 33 (INSTRUMENTS) ×3 IMPLANT
SLEEVE ENDOPATH XCEL 5M (ENDOMECHANICALS) ×6 IMPLANT
SUT MNCRL 4-0 (SUTURE) ×2
SUT MNCRL 4-0 27XMFL (SUTURE) ×1
SUT MNCRL AB 4-0 PS2 18 (SUTURE) ×3 IMPLANT
SUT VIC AB 0 CT1 36 (SUTURE) IMPLANT
SUT VICRYL 0 AB UR-6 (SUTURE) ×3 IMPLANT
SUTURE MNCRL 4-0 27XMF (SUTURE) ×1 IMPLANT
TROCAR XCEL NON-BLD 11X100MML (ENDOMECHANICALS) ×3 IMPLANT
TROCAR XCEL NON-BLD 5MMX100MML (ENDOMECHANICALS) ×3 IMPLANT
TUBING INSUFFLATION (TUBING) ×3 IMPLANT

## 2017-10-10 NOTE — Op Note (Signed)
Preoperative diagnosis: Symptomatic cholelithiasis  Postoperative diagnosis: Acute over chronic cholecystitis.  Procedure: Laparoscopic Cholecystectomy.   Anesthesia: GETA   Surgeon: Dr. Hazle Quant  Wound Classification: Contaminated  Indications: Patient is a 32 y.o. female developed right upper quadrant pain and nausea and on workup was found to have cholelithiasis with a normal common duct without sign of cholecystitis. Laparoscopic cholecystectomy was elected.  Findings: Severely inflamed, acute over chronic inflammation.  Critical view of safety achieved Cystic duct and artery identified, ligated and divided Adequate hemostasis  Description of procedure: The patient was placed on the operating table in the supine position. General anesthesia was induced. A time-out was completed verifying correct patient, procedure, site, positioning, and implant(s) and/or special equipment prior to beginning this procedure. An orogastric tube was placed. The abdomen was prepped and draped in the usual sterile fashion.  An incision was made in a natural skin line above the umbilicus.  The fascia was elevated and the Veress needle inserted. Proper position was confirmed by aspiration and saline meniscus test.  The abdomen was insufflated with carbon dioxide to a pressure of 15 mmHg. The patient tolerated insufflation well. A 11-mm trocar was then inserted.  The laparoscope was inserted and the abdomen inspected. No injuries from initial trocar placement were noted. Additional trocars were then inserted in the following locations: a 5-mm trocar in the right epigastrium and two 5-mm trocars along the right costal margin. The abdomen was inspected and no abnormalities were found. The table was placed in the reverse Trendelenburg position with the right side up.  Thick inflamed adhesions between the gallbladder and omentum, duodenum and transverse colon were lysed sharply and with cautery cautiosly. The  dome of the gallbladder was not able to be grasped with an atraumatic grasper and needed to be drained. Then it was grasped and retracted over the dome of the liver. The infundibulum was also grasped with an atraumatic grasper through the midclavicular port and retracted toward the right lower quadrant. This maneuver exposed Calot's triangle. The peritoneum overlying the gallbladder infundibulum was carefully dissected with thick chronic inflammation. The cystic duct and cystic artery identified and circumferentially dissected. The critical view of safety was reviewed before ligating structures. The cystic duct and cystic artery were then doubly clipped and divided close to the gallbladder.  The gallbladder was then dissected from its peritoneal attachments by electrocautery. Hemostasis was checked and the gallbladder and contained stones were removed using an endoscopic retrieval bag placed through the umbilical port. The gallbladder was passed off the table as a specimen. The gallbladder fossa was copiously irrigated with saline and hemostasis was obtained. There was no evidence of bleeding from the gallbladder fossa or cystic artery or leakage of the bile from the cystic duct stump.  No bleeding was noted. The umbilical fascia was closed with laparoscopic suture passer with Vicryl 0. The laparoscope was withdrawn. The abdomen was allowed to collapse. Secondary trocars were removed under direct vision.The skin was closed with subcuticular sutures of 4-0 monocryl and topical skin adhesive. The orogastric tube was removed.  The patient tolerated the procedure well and was taken to the postanesthesia care unit in stable condition.   Specimen: Gallbladder  Complications: None  EBL: 25 mL

## 2017-10-10 NOTE — Anesthesia Procedure Notes (Signed)
Procedure Name: Intubation Date/Time: 10/10/2017 1:09 PM Performed by: Eben Burow, CRNA Pre-anesthesia Checklist: Patient identified, Emergency Drugs available, Suction available, Patient being monitored and Timeout performed Patient Re-evaluated:Patient Re-evaluated prior to induction Oxygen Delivery Method: Circle system utilized Preoxygenation: Pre-oxygenation with 100% oxygen Induction Type: IV induction Ventilation: Mask ventilation without difficulty Laryngoscope Size: Miller and 2 Grade View: Grade I Tube type: Oral Tube size: 7.0 mm Number of attempts: 1 Airway Equipment and Method: Stylet and LTA kit utilized Placement Confirmation: ETT inserted through vocal cords under direct vision,  positive ETCO2 and breath sounds checked- equal and bilateral Secured at: 21 cm Tube secured with: Tape Dental Injury: Teeth and Oropharynx as per pre-operative assessment

## 2017-10-10 NOTE — Transfer of Care (Signed)
Immediate Anesthesia Transfer of Care Note  Patient: Susan Clay  Procedure(s) Performed: Procedure(s): LAPAROSCOPIC CHOLECYSTECTOMY (N/A)  Patient Location: PACU  Anesthesia Type:General  Level of Consciousness: sedated  Airway & Oxygen Therapy: Patient Spontanous Breathing and Patient connected to face mask oxygen  Post-op Assessment: Report given to RN and Post -op Vital signs reviewed and stable  Post vital signs: Reviewed and stable  Last Vitals:  Vitals:   10/10/17 1116 10/10/17 1611  BP: 123/87 129/85  Pulse: 88 94  Resp: 20 13  Temp: 36.6 C 36.8 C  SpO2: 98% 99%    Complications: No apparent anesthesia complications

## 2017-10-10 NOTE — Interval H&P Note (Signed)
History and Physical Interval Note:  10/10/2017 12:22 PM  Susan Clay  has presented today for surgery, with the diagnosis of cholelithiasis without cholecystitis  The various methods of treatment have been discussed with the patient and family. After consideration of risks, benefits and other options for treatment, the patient has consented to  Procedure(s): LAPAROSCOPIC CHOLECYSTECTOMY (N/A) as a surgical intervention .  The patient's history has been reviewed, patient examined, no change in status, stable for surgery.  I have reviewed the patient's chart and labs.  Questions were answered to the patient's satisfaction.     Carolan Shiver

## 2017-10-10 NOTE — Anesthesia Post-op Follow-up Note (Signed)
Anesthesia QCDR form completed.        

## 2017-10-10 NOTE — Anesthesia Postprocedure Evaluation (Signed)
Anesthesia Post Note  Patient: Susan Clay  Procedure(s) Performed: LAPAROSCOPIC CHOLECYSTECTOMY (N/A )  Patient location during evaluation: PACU Anesthesia Type: General Level of consciousness: awake and alert Pain management: pain level controlled Vital Signs Assessment: post-procedure vital signs reviewed and stable Respiratory status: spontaneous breathing, nonlabored ventilation, respiratory function stable and patient connected to nasal cannula oxygen Cardiovascular status: blood pressure returned to baseline and stable Postop Assessment: no apparent nausea or vomiting Anesthetic complications: no     Last Vitals:  Vitals:   10/10/17 1656 10/10/17 1710  BP: 109/79 125/82  Pulse: 87 81  Resp: 17 14  Temp: 37 C 36.9 C  SpO2: 96% 99%    Last Pain:  Vitals:   10/10/17 1804  TempSrc:   PainSc: 5                  Lenard Simmer

## 2017-10-10 NOTE — Brief Op Note (Signed)
10/10/2017  4:26 PM  PATIENT:  Susan Clay  32 y.o. female  PRE-OPERATIVE DIAGNOSIS:  cholelithiasis without cholecystitis  POST-OPERATIVE DIAGNOSIS:  Acute over chronic cholecystitis  PROCEDURE:  Procedure(s): LAPAROSCOPIC CHOLECYSTECTOMY (N/A)  SURGEON:  Surgeon(s) and Role:    Carolan Shiver, MD - Primary  ANESTHESIA:   general  EBL:  25 mL

## 2017-10-10 NOTE — Discharge Instructions (Signed)
°  Diet: Resume home heart healthy regular diet.   Activity: No heavy lifting >20 pounds (children, pets, laundry, garbage) or strenuous activity until follow-up, but light activity and walking are encouraged. Do not drive or drink alcohol if taking narcotic pain medications.  Wound care: Remove dressing of umbilical area tomorrow morning. Once dressing removed, may shower with soapy water and pat dry (do not rub incisions), but no baths or submerging incision underwater until follow-up. (no swimming)   Medications: Resume all home medications. For mild to moderate pain: acetaminophen (Tylenol) or ibuprofen (if no kidney disease). Combining Tylenol with alcohol can substantially increase your risk of causing liver disease. Narcotic pain medications, if prescribed, can be used for severe pain, though may cause nausea, constipation, and drowsiness. Do not combine Tylenol and Norco within a 6 hour period as Norco contains Tylenol. If you do not need the narcotic pain medication, you do not need to fill the prescription.  Call office [phone]) at any time if any questions, worsening pain, fevers/chills, bleeding, drainage from incision site, or other concerns.     AMBULATORY SURGERY  DISCHARGE INSTRUCTIONS   1) The drugs that you were given will stay in your system until tomorrow so for the next 24 hours you should not:  A) Drive an automobile B) Make any legal decisions C) Drink any alcoholic beverage   2) You may resume regular meals tomorrow.  Today it is better to start with liquids and gradually work up to solid foods.  You may eat anything you prefer, but it is better to start with liquids, then soup and crackers, and gradually work up to solid foods.   3) Please notify your doctor immediately if you have any unusual bleeding, trouble breathing, redness and pain at the surgery site, drainage, fever, or pain not relieved by medication.    4) Additional  Instructions:        Please contact your physician with any problems or Same Day Surgery at [phone], Monday through Friday 6 am to 4 pm, or [place] at [facility] number at [phone].

## 2017-10-10 NOTE — OR Nursing (Signed)
Discharge instructions discussed with pt and family. All voice understanding. 

## 2017-10-10 NOTE — Anesthesia Preprocedure Evaluation (Signed)
Anesthesia Evaluation  Patient identified by MRN, date of birth, ID band Patient awake    Reviewed: Allergy & Precautions, NPO status , Patient's Chart, lab work & pertinent test results, reviewed documented beta blocker date and time   Airway Mallampati: III  TM Distance: >3 FB     Dental  (+) Chipped   Pulmonary Current Smoker,           Cardiovascular      Neuro/Psych Anxiety    GI/Hepatic GERD  Controlled,  Endo/Other    Renal/GU      Musculoskeletal  (+) Arthritis ,   Abdominal   Peds  Hematology   Anesthesia Other Findings Obese.PCOS.  Reproductive/Obstetrics                             Anesthesia Physical Anesthesia Plan  ASA: III  Anesthesia Plan: General   Post-op Pain Management:    Induction: Intravenous  PONV Risk Score and Plan:   Airway Management Planned: Oral ETT  Additional Equipment:   Intra-op Plan:   Post-operative Plan:   Informed Consent: I have reviewed the patients History and Physical, chart, labs and discussed the procedure including the risks, benefits and alternatives for the proposed anesthesia with the patient or authorized representative who has indicated his/her understanding and acceptance.     Plan Discussed with: CRNA  Anesthesia Plan Comments:         Anesthesia Quick Evaluation

## 2017-10-11 ENCOUNTER — Encounter: Payer: Self-pay | Admitting: General Surgery

## 2017-10-15 LAB — SURGICAL PATHOLOGY

## 2018-06-15 ENCOUNTER — Ambulatory Visit
Admission: RE | Admit: 2018-06-15 | Discharge: 2018-06-15 | Disposition: A | Discharge status: Home / Self Care | Payer: Managed Care, Other (non HMO) | Source: Ambulatory Visit | Attending: Physician Assistant | Admitting: Physician Assistant

## 2018-06-15 ENCOUNTER — Other Ambulatory Visit (HOSPITAL_COMMUNITY): Payer: Self-pay | Admitting: Physician Assistant

## 2018-06-15 ENCOUNTER — Other Ambulatory Visit: Payer: Self-pay | Admitting: Physician Assistant

## 2018-06-15 ENCOUNTER — Other Ambulatory Visit
Admission: RE | Admit: 2018-06-15 | Discharge: 2018-06-15 | Disposition: A | Discharge status: Home / Self Care | Payer: Managed Care, Other (non HMO) | Source: Ambulatory Visit | Attending: Physician Assistant | Admitting: Physician Assistant

## 2018-06-15 DIAGNOSIS — R7989 Other specified abnormal findings of blood chemistry: Secondary | ICD-10-CM | POA: Diagnosis present

## 2018-06-15 DIAGNOSIS — R0789 Other chest pain: Secondary | ICD-10-CM | POA: Diagnosis present

## 2018-06-15 DIAGNOSIS — R0602 Shortness of breath: Secondary | ICD-10-CM | POA: Diagnosis not present

## 2018-06-15 LAB — FIBRIN DERIVATIVES D-DIMER (ARMC ONLY): FIBRIN DERIVATIVES D-DIMER (ARMC): 569.36 ng{FEU}/mL — AB (ref 0.00–499.00)

## 2018-06-15 MED ORDER — IOHEXOL 350 MG/ML SOLN
75.0000 mL | Freq: Once | INTRAVENOUS | Status: AC | PRN
  Administered 2018-06-15: 75 mL via INTRAVENOUS

## 2018-08-05 ENCOUNTER — Other Ambulatory Visit: Payer: Self-pay

## 2018-08-05 ENCOUNTER — Encounter
Admission: RE | Admit: 2018-08-05 | Discharge: 2018-08-05 | Disposition: A | Discharge status: Home / Self Care | Payer: Managed Care, Other (non HMO) | Source: Ambulatory Visit | Attending: Obstetrics & Gynecology | Admitting: Obstetrics & Gynecology

## 2018-08-05 NOTE — Patient Instructions (Addendum)
Your procedure is scheduled on: 08-10-18  Report to Same Day Surgery 2nd floor medical mall [facility] Entrance-take elevator on left to 2nd floor.  Check in with surgery information desk.) To find out your arrival time please call [phone] between 1PM - 3PM on 08-07-18   Remember: Instructions that are not followed completely may result in serious medical risk, up to and including death, or upon the discretion of your surgeon and anesthesiologist your surgery may need to be rescheduled.    _x___ 1. Do not eat food after midnight the night before your procedure. You may drink clear liquids up to 2 hours before you are scheduled to arrive at the hospital for your procedure.  Do not drink clear liquids within 2 hours of your scheduled arrival to the hospital.  Clear liquids include  --Water or Apple juice without pulp  --Clear carbohydrate beverage such as ClearFast or Gatorade  --Black Coffee or Clear Tea (No milk, no creamers, do not add anything to the coffee or Tea   ____Ensure clear carbohydrate drink on the way to the hospital for bariatric patients  ____Ensure clear carbohydrate drink 3 hours before surgery for Dr Rutherford Nail patients if physician instructed.   No gum chewing or hard candies.     __x__ 2. No Alcohol for 24 hours before or after surgery.   __x__3. No Smoking or e-cigarettes for 24 prior to surgery.  Do not use any chewable tobacco products for at least 6 hour prior to surgery   ____  4. Bring all medications with you on the day of surgery if instructed.    __x__ 5. Notify your doctor if there is any change in your medical condition     (cold, fever, infections).    x___6. On the morning of surgery brush your teeth with toothpaste and water.  You may rinse your mouth with mouth wash if you wish.  Do not swallow any toothpaste or mouthwash.   Do not wear jewelry, make-up, hairpins, clips or nail polish.  Do not wear lotions, powders, or perfumes. You may wear  deodorant.  Do not shave 48 hours prior to surgery. Men may shave face and neck.  Do not bring valuables to the hospital.    [facility] is not responsible for any belongings or valuables.               Contacts, dentures or bridgework may not be worn into surgery.  Leave your suitcase in the car. After surgery it may be brought to your room.  For patients admitted to the hospital, discharge time is determined by your treatment team.  _  Patients discharged the day of surgery will not be allowed to drive home.  You will need someone to drive you home and stay with you the night of your procedure.    Please read over the following fact sheets that you were given:   [facility] Preparing for Surgery   _x___ TAKE THE FOLLOWING MEDICATION THE MORNING OF SURGERY WITH A SMALL SIP OF WATER. These include:  1. PROTONIX  2. TAKE A PROTONIX THE NIGHT BEFORE SURGERY   3.  4.  5.  6.  ____Fleets enema or Magnesium Citrate as directed.   ____ Use CHG Soap or sage wipes as directed on instruction sheet   ____ Use inhalers on the day of surgery and bring to hospital day of surgery  ____ Stop Metformin and Janumet 2 days prior to surgery.    ____  Take 1/2 of usual insulin dose the night before surgery and none on the morning surgery.   ____ Follow recommendations from Cardiologist, Pulmonologist or PCP regarding stopping Aspirin, Coumadin, Plavix ,Eliquis, Effient, or Pradaxa, and Pletal.  X____Stop Anti-inflammatories such as Advil, Aleve, Ibuprofen, Motrin, Naproxen,MELOXICAM, Naprosyn, Goodies powders or aspirin products NOW-OK to take Tylenol    ____ Stop supplements until after surgery   ____ Bring C-Pap to the hospital.

## 2018-08-05 NOTE — Pre-Procedure Instructions (Signed)
ECG 12-lead1/27/2020 [facility] System Component Name Value Ref Range  Vent Rate (bpm) 77   PR Interval (msec) 138   QRS Interval (msec) 80   QT Interval (msec) 384   QTc (msec) 434   Other Result Information  This result has an attachment that is not available.  Result Narrative  Normal sinus rhythm Normal ECG No previous ECGs available I reviewed and concur with this report. Electronically signed LE:[email or web address] MD, MARK ([id number]) on 06/18/2018 7:40:57 AM  Status Results Details

## 2018-08-05 NOTE — Pre-Procedure Instructions (Signed)
Echo complete1/28/2020 [facility] Component Name Value Ref Range  LV Ejection Fraction (%) 55   Aortic Valve Stenosis Grade none   Aortic Valve Regurgitation Grade trivial   Mitral Valve Stenosis Grade none   Mitral Valve Regurgitation Grade trivial   Tricuspid Valve Regurgitation Grade mild   Tricuspid Valve Regurgitation Max Velocity (m/s) 2.9 m/sec  Right Ventricle Systolic Pressure (mmHg) 37.4 mmHg  LV End Diastolic Diameter (cm) 4.6 cm  LV End Systolic Diameter (cm) 3.2 cm  LV Septum Wall Thickness (cm) 0.78 cm  LV Posterior Wall Thickness (cm) 0.75 cm  Left Atrium Diameter (cm) 3.6 cm  Result Narrative          INTERNAL MEDICINE DEPARTMENT         AVARAE, ZWART      [facility]                  [id number]      [facility]              Acct #: [id number]      [address] [place], [place] [address]   Date: 06/16/2018 01: 13 PM                                Adult  Female  Age: 33 yrs      ECHOCARDIOGRAM REPORT               Outpatient                                KC^^KCWI    STUDY:CHEST WALL        TAPE:0000: 00: 0: 00: 00 MD1: MCLAUGHLIN, MIRIAM KLEM    ECHO:Yes  DOPPLER:Yes    FILE:0000-000-000    BP: 114/78 mmHg    COLOR:Yes  CONTRAST:No   MACHINE:Philips  RV BIOPSY:No     3D:No SOUND QLTY:Moderate      Height: 62 in   MEDIUM:None                       Weight: 214 lb                                BSA: 2.0 m2 _________________________________________________________________________________________        HISTORY: DOE         REASON: Assess, LV function       INDICATION: Shortness of breath [R06.02 (ICD-10-CM)]  , Mid sternal chest             pain [R07.89  (ICD-10-CM)]; _________________________________________________________________________________________ ECHOCARDIOGRAPHIC MEASUREMENTS 2D DIMENSIONS AORTA         Values  Normal Range  MAIN PA     Values  Normal Range        Annulus: nm*     [2.1-2.5]     PA Main: nm*    [1.5-2.1]       Aorta Sin: nm*     [2.7-3.3]  RIGHT VENTRICLE      ST Junction: nm*     [2.3-2.9]     RV Base: 4.7 cm  [< 4.2]       Asc.Aorta: nm*     [2.3-3.1]     RV Mid: nm*    [<3.5] LEFT VENTRICLE  RV Length: nm*    [<8.6]         LVIDd: 4.6 cm    [3.9-5.3]  INFERIOR VENA CAVA         LVIDs: 3.2 cm            Max. IVC: nm*    [<=2.1]           FS: 31.0 %    [>25]      Min. IVC: nm*          SWT: 0.78 cm   [0.5-0.9]  ------------------          PWT: 0.75 cm   [0.5-0.9]  nm* - not measured LEFT ATRIUM        LA Diam: 3.6 cm    [2.7-3.8]      LA A4C Area: nm*     [<20]       LA Volume: nm*     [22-52] _________________________________________________________________________________________ ECHOCARDIOGRAPHIC DESCRIPTIONS AORTIC ROOT          Size: Normal       Dissection: INDETERM FOR DISSECTION AORTIC VALVE        Leaflets: Tricuspid          Morphology: Normal        Mobility: Fully mobile LEFT VENTRICLE          Size: Normal            Anterior: Normal      Contraction: Normal             Lateral: Normal       Closest EF: >55% (Estimated)        Septal: Normal       LV Masses: No Masses            Apical: Normal          LVH: None             Inferior: Normal                           Posterior: Normal      Dias.FxClass:  N/A MITRAL VALVE        Leaflets: Normal            Mobility: Fully mobile       Morphology: Normal LEFT ATRIUM          Size: Normal            LA Masses: No masses       IA Septum: Normal IAS MAIN PA          Size: Normal PULMONIC VALVE       Morphology: Normal            Mobility: Fully mobile RIGHT VENTRICLE       RV Masses: No Masses             Size: Normal       Free Wall: Normal           Contraction: Normal TRICUSPID VALVE        Leaflets: Normal            Mobility: Fully mobile       Morphology: Normal RIGHT ATRIUM          Size: Normal            RA Other: None        RA Mass: No masses PERICARDIUM  Fluid: No effusion INFERIOR VENACAVA          Size: Normal Normal respiratory collapse _________________________________________________________________________________________  DOPPLER ECHO and OTHER SPECIAL PROCEDURES         Aortic: TRIVIAL AR         No AS         Mitral: TRIVIAL MR         No MS             MV Inflow E Vel = 112.0 cm/sec  MV Annulus E'Vel = 13.3 cm/sec             E/E'Ratio = 8.4       Tricuspid: MILD TR          No TS             293.2 cm/sec peak TR vel  37.4 mmHg peak RV pressure       Pulmonary: TRIVIAL PR         No PS _________________________________________________________________________________________ INTERPRETATION NORMAL LEFT VENTRICULAR SYSTOLIC FUNCTION NORMAL RIGHT VENTRICULAR SYSTOLIC FUNCTION MILD VALVULAR REGURGITATION (See above) NO VALVULAR STENOSIS _________________________________________________________________________________________ Electronically signed by   Danella Penton, MD on 06/16/2018 06: 32 PM      Performed By: Johnathan Hausen    Ordering Physician: Maurine Minister _________________________________________________________________________________________  Other Result Information  Interface, Text Results In - 06/16/2018  6:32 PM EST                 INTERNAL MEDICINE DEPARTMENT                 OKTOBER, GLUECKERT           [facility]                                    [id number]           [facility]                           Acct #: [id number]           [address] [place],  [place] [address]      Date: 06/16/2018 01: 13 PM                                                              Adult   Female   Age: 55 yrs           ECHOCARDIOGRAM REPORT                              Outpatient                                                              KC^^KCWI      STUDY:CHEST WALL               TAPE:0000: 00: 0: 00: 00 MD1: MCLAUGHLIN, MIRIAM KLEM  ECHO:Yes    DOPPLER:Yes       FILE:0000-000-000        BP: 114/78 mmHg      COLOR:Yes   CONTRAST:No     MACHINE:Philips  RV BIOPSY:No          3D:No  SOUND QLTY:Moderate            Height: 62 in     MEDIUM:None                                              Weight: 214 lb                                                              BSA: 2.0 m2 _________________________________________________________________________________________               HISTORY: DOE                REASON: Assess, LV function            INDICATION: Shortness of breath [R06.02 (ICD-10-CM)]    , Mid sternal chest                        pain [R07.89 (ICD-10-CM)]; _________________________________________________________________________________________ ECHOCARDIOGRAPHIC MEASUREMENTS 2D DIMENSIONS AORTA                  Values   Normal Range   MAIN PA         Values    Normal Range               Annulus: nm*          [2.1-2.5]         PA Main: nm*       [1.5-2.1]             Aorta Sin: nm*          [2.7-3.3]    RIGHT VENTRICLE           ST Junction: nm*          [2.3-2.9]         RV Base:  4.7 cm    [< 4.2]             Asc.Aorta: nm*          [2.3-3.1]          RV Mid: nm*       [<3.5] LEFT VENTRICLE                                      RV Length: nm*       [<8.6]                 LVIDd: 4.6 cm       [3.9-5.3]    INFERIOR VENA CAVA                 LVIDs: 3.2 cm                        Max. IVC: nm*       [<=  2.1]                    FS: 31.0 %       [>25]            Min. IVC: nm*                   SWT: 0.78 cm      [0.5-0.9]    ------------------                   PWT: 0.75 cm      [0.5-0.9]    nm* - not measured LEFT ATRIUM               LA Diam: 3.6 cm       [2.7-3.8]           LA A4C Area: nm*          [<20]             LA Volume: nm*          [22-52] _________________________________________________________________________________________ ECHOCARDIOGRAPHIC DESCRIPTIONS AORTIC ROOT                  Size: Normal            Dissection: INDETERM FOR DISSECTION AORTIC VALVE              Leaflets: Tricuspid                   Morphology: Normal              Mobility: Fully mobile LEFT VENTRICLE                  Size: Normal                        Anterior: Normal           Contraction: Normal                         Lateral: Normal            Closest EF: >55% (Estimated)                Septal: Normal             LV Masses: No Masses                       Apical: Normal                   LVH: None                          Inferior: Normal                                                     Posterior: Normal          Dias.FxClass: N/A MITRAL VALVE              Leaflets: Normal                        Mobility: Fully mobile            Morphology:  Normal LEFT ATRIUM                  Size: Normal                       LA Masses: No masses             IA Septum: Normal IAS MAIN PA                  Size: Normal PULMONIC VALVE            Morphology: Normal                        Mobility: Fully mobile RIGHT VENTRICLE             RV Masses: No Masses                         Size:  Normal             Free Wall: Normal                     Contraction: Normal TRICUSPID VALVE              Leaflets: Normal                        Mobility: Fully mobile            Morphology: Normal RIGHT ATRIUM                  Size: Normal                        RA Other: None               RA Mass: No masses PERICARDIUM                 Fluid: No effusion INFERIOR VENACAVA                  Size: Normal Normal respiratory collapse _________________________________________________________________________________________  DOPPLER ECHO and OTHER SPECIAL PROCEDURES                Aortic: TRIVIAL AR                 No AS                Mitral: TRIVIAL MR                 No MS                        MV Inflow E Vel = 112.0 cm/sec    MV Annulus E'Vel = 13.3 cm/sec                        E/E'Ratio = 8.4             Tricuspid: MILD TR                    No TS                        293.2 cm/sec peak TR vel   37.4 mmHg peak RV pressure  Pulmonary: TRIVIAL PR                 No PS _________________________________________________________________________________________ INTERPRETATION NORMAL LEFT VENTRICULAR SYSTOLIC FUNCTION NORMAL RIGHT VENTRICULAR SYSTOLIC FUNCTION MILD VALVULAR REGURGITATION (See above) NO VALVULAR STENOSIS _________________________________________________________________________________________ Electronically signed by      Danella Penton, MD on 06/16/2018 06: 32 PM          Performed By: Johnathan Hausen    Ordering Physician: Maurine Minister _________________________________________________________________________________________  Status Results Details   Encounter Summary

## 2018-08-10 ENCOUNTER — Encounter: Admission: RE | Payer: Self-pay | Source: Home / Self Care

## 2018-08-10 ENCOUNTER — Ambulatory Visit
Admission: RE | Admit: 2018-08-10 | Payer: Managed Care, Other (non HMO) | Source: Home / Self Care | Admitting: Obstetrics & Gynecology

## 2018-08-10 SURGERY — HYSTERECTOMY, SUPRACERVICAL, LAPAROSCOPIC
Anesthesia: General

## 2018-10-19 ENCOUNTER — Encounter
Admission: RE | Admit: 2018-10-19 | Discharge: 2018-10-19 | Disposition: A | Discharge status: Home / Self Care | Payer: Managed Care, Other (non HMO) | Source: Ambulatory Visit | Attending: Obstetrics & Gynecology | Admitting: Obstetrics & Gynecology

## 2018-10-19 ENCOUNTER — Other Ambulatory Visit: Payer: Self-pay

## 2018-10-19 DIAGNOSIS — Z01812 Encounter for preprocedural laboratory examination: Secondary | ICD-10-CM | POA: Diagnosis present

## 2018-10-19 DIAGNOSIS — Z1159 Encounter for screening for other viral diseases: Secondary | ICD-10-CM | POA: Insufficient documentation

## 2018-10-19 NOTE — Patient Instructions (Addendum)
Your procedure is scheduled on: Friday 10/23/18 Report to [place]. To find out your arrival time please call [phone] between 1PM - 3PM on Thursday 10/22/18.  Remember: Instructions that are not followed completely may result in serious medical risk, up to and including death, or upon the discretion of your surgeon and anesthesiologist your surgery may need to be rescheduled.     _X__ 1. Do not eat food after midnight the night before your procedure.                 No gum chewing or hard candies. You may drink clear liquids up to 2 hours                 before you are scheduled to arrive for your surgery- DO not drink clear                 liquids within 2 hours of the start of your surgery.                 Clear Liquids include:  water, apple juice without pulp, clear carbohydrate                 drink such as Clearfast or Gatorade, Black Coffee or Tea (Do not add                 anything to coffee or tea).  __X__2.  On the morning of surgery brush your teeth with toothpaste and water, you                 may rinse your mouth with mouthwash if you wish.  Do not swallow any              toothpaste of mouthwash.     _X__ 3.  No Alcohol for 24 hours before or after surgery.   _X__ 4.  Do Not Smoke or use e-cigarettes For 24 Hours Prior to Your Surgery.                 Do not use any chewable tobacco products for at least 6 hours prior to                 surgery.  ____  5.  Bring all medications with you on the day of surgery if instructed.   __X__  6.  Notify your doctor if there is any change in your medical condition      (cold, fever, infections).     Do not wear jewelry, make-up, hairpins, clips or nail polish. Do not wear lotions, powders, or perfumes.  Do not shave 48 hours prior to surgery. Men may shave face and neck. Do not bring valuables to the hospital.    [facility] is not responsible for any belongings or  valuables.  Contacts, dentures/partials or body piercings may not be worn into surgery. Bring a case for your contacts, glasses or hearing aids, a denture cup will be supplied. Leave your suitcase in the car. After surgery it may be brought to your room. For patients admitted to the hospital, discharge time is determined by your treatment team.   Patients discharged the day of surgery will not be allowed to drive home.   Please read over the following fact sheets that you were given:   MRSA Information  __X__ Take these medicines the morning of surgery with A SIP OF WATER:  1.   2.   3.   4.  5.  6.  ____ Fleet Enema (as directed)   __X__ Use CHG Soap/SAGE wipes as directed  ____ Use inhalers on the day of surgery  ____ Stop metformin/Janumet/Farxiga 2 days prior to surgery    ____ Take 1/2 of usual insulin dose the night before surgery. No insulin the morning          of surgery.   ____ Stop Blood Thinners Coumadin/Plavix/Xarelto/Pleta/Pradaxa/Eliquis/Effient/Aspirin  on   Or contact your Surgeon, Cardiologist or Medical Doctor regarding  ability to stop your blood thinners  __X__ Stop Anti-inflammatories 7 days before surgery such as Advil, Ibuprofen, Motrin,  BC or Goodies Powder, Naprosyn, Naproxen, Aleve, Aspirin    __X__ Stop all herbal supplements, fish oil or vitamin E until after surgery.    ____ Bring C-Pap to the hospital.     COVID TEST Tuesday 10/20/18

## 2018-10-19 NOTE — OR Nursing (Signed)
ECG 12-lead1/27/2020 [facility] Component Name Value Ref Range  Vent Rate (bpm) 77   PR Interval (msec) 138   QRS Interval (msec) 80   QT Interval (msec) 384   QTc (msec) 434   Other Result Information  This result has an attachment that is not available.  Result Narrative  Normal sinus rhythm Normal ECG No previous ECGs available I reviewed and concur with this report. Electronically signed VA:POLIDC MD, MARK ([id number]) on 06/18/2018 7:40:57 AM  Status Results Details

## 2018-10-20 ENCOUNTER — Other Ambulatory Visit
Admission: RE | Admit: 2018-10-20 | Discharge: 2018-10-20 | Disposition: A | Discharge status: Home / Self Care | Payer: Managed Care, Other (non HMO) | Source: Ambulatory Visit | Attending: Obstetrics & Gynecology | Admitting: Obstetrics & Gynecology

## 2018-10-20 DIAGNOSIS — Z01812 Encounter for preprocedural laboratory examination: Secondary | ICD-10-CM | POA: Diagnosis not present

## 2018-10-21 LAB — NOVEL CORONAVIRUS, NAA (HOSP ORDER, SEND-OUT TO REF LAB; TAT 18-24 HRS): SARS-CoV-2, NAA: NOT DETECTED

## 2018-10-23 ENCOUNTER — Ambulatory Visit: Payer: Managed Care, Other (non HMO) | Admitting: Anesthesiology

## 2018-10-23 ENCOUNTER — Ambulatory Visit
Admission: RE | Admit: 2018-10-23 | Discharge: 2018-10-23 | Disposition: A | Discharge status: Home / Self Care | Payer: Managed Care, Other (non HMO) | Attending: Obstetrics & Gynecology | Admitting: Obstetrics & Gynecology

## 2018-10-23 ENCOUNTER — Other Ambulatory Visit: Payer: Self-pay

## 2018-10-23 ENCOUNTER — Encounter: Payer: Self-pay | Admitting: *Deleted

## 2018-10-23 ENCOUNTER — Encounter
Admission: RE | Disposition: A | Discharge status: Home / Self Care | Payer: Self-pay | Source: Home / Self Care | Attending: Obstetrics & Gynecology

## 2018-10-23 DIAGNOSIS — E669 Obesity, unspecified: Secondary | ICD-10-CM | POA: Insufficient documentation

## 2018-10-23 DIAGNOSIS — K589 Irritable bowel syndrome without diarrhea: Secondary | ICD-10-CM | POA: Insufficient documentation

## 2018-10-23 DIAGNOSIS — F17210 Nicotine dependence, cigarettes, uncomplicated: Secondary | ICD-10-CM | POA: Insufficient documentation

## 2018-10-23 DIAGNOSIS — M1990 Unspecified osteoarthritis, unspecified site: Secondary | ICD-10-CM | POA: Insufficient documentation

## 2018-10-23 DIAGNOSIS — E282 Polycystic ovarian syndrome: Secondary | ICD-10-CM | POA: Insufficient documentation

## 2018-10-23 DIAGNOSIS — R7303 Prediabetes: Secondary | ICD-10-CM | POA: Insufficient documentation

## 2018-10-23 DIAGNOSIS — Z79899 Other long term (current) drug therapy: Secondary | ICD-10-CM | POA: Diagnosis not present

## 2018-10-23 DIAGNOSIS — N9489 Other specified conditions associated with female genital organs and menstrual cycle: Secondary | ICD-10-CM | POA: Diagnosis not present

## 2018-10-23 DIAGNOSIS — N939 Abnormal uterine and vaginal bleeding, unspecified: Secondary | ICD-10-CM | POA: Diagnosis present

## 2018-10-23 DIAGNOSIS — N800 Endometriosis of uterus: Secondary | ICD-10-CM | POA: Insufficient documentation

## 2018-10-23 DIAGNOSIS — K219 Gastro-esophageal reflux disease without esophagitis: Secondary | ICD-10-CM | POA: Insufficient documentation

## 2018-10-23 DIAGNOSIS — R102 Pelvic and perineal pain: Secondary | ICD-10-CM | POA: Insufficient documentation

## 2018-10-23 DIAGNOSIS — N736 Female pelvic peritoneal adhesions (postinfective): Secondary | ICD-10-CM | POA: Insufficient documentation

## 2018-10-23 DIAGNOSIS — F419 Anxiety disorder, unspecified: Secondary | ICD-10-CM | POA: Diagnosis not present

## 2018-10-23 DIAGNOSIS — N938 Other specified abnormal uterine and vaginal bleeding: Secondary | ICD-10-CM

## 2018-10-23 HISTORY — PX: LAPAROSCOPIC SUPRACERVICAL HYSTERECTOMY: SHX5399

## 2018-10-23 HISTORY — PX: LAPAROSCOPIC BILATERAL SALPINGECTOMY: SHX5889

## 2018-10-23 HISTORY — PX: IUD REMOVAL: SHX5392

## 2018-10-23 LAB — BASIC METABOLIC PANEL
Anion gap: 9 (ref 5–15)
BUN: 13 mg/dL (ref 6–20)
CO2: 24 mmol/L (ref 22–32)
Calcium: 8.9 mg/dL (ref 8.9–10.3)
Chloride: 106 mmol/L (ref 98–111)
Creatinine, Ser: 0.81 mg/dL (ref 0.44–1.00)
GFR calc Af Amer: >60 mL/min (ref >60)
GFR calc non Af Amer: >60 mL/min (ref >60)
Glucose, Bld: 82 mg/dL (ref 70–99)
Potassium: 3.6 mmol/L (ref 3.5–5.1)
Sodium: 139 mmol/L (ref 135–145)

## 2018-10-23 LAB — CBC
HCT: 38.5 % (ref 36.0–46.0)
Hemoglobin: 12.7 g/dL (ref 12.0–15.0)
MCH: 29.4 pg (ref 26.0–34.0)
MCHC: 33 g/dL (ref 30.0–36.0)
MCV: 89.1 fL (ref 80.0–100.0)
Platelets: 297 10*3/uL (ref 150–400)
RBC: 4.32 MIL/uL (ref 3.87–5.11)
RDW: 13.8 % (ref 11.5–15.5)
WBC: 11.3 10*3/uL — ABNORMAL HIGH (ref 4.0–10.5)
nRBC: 0 % (ref 0.0–0.2)

## 2018-10-23 LAB — TYPE AND SCREEN
ABO/RH(D): O POS
Antibody Screen: NEGATIVE

## 2018-10-23 LAB — ABO/RH: ABO/RH(D): O POS

## 2018-10-23 LAB — POCT PREGNANCY, URINE: Preg Test, Ur: NEGATIVE

## 2018-10-23 SURGERY — HYSTERECTOMY, SUPRACERVICAL, LAPAROSCOPIC
Anesthesia: General

## 2018-10-23 MED ORDER — ACETAMINOPHEN 325 MG PO TABS: 650.0000 mg | ORAL_TABLET | ORAL | Status: DC | PRN

## 2018-10-23 MED ORDER — LIDOCAINE HCL (CARDIAC) PF 100 MG/5ML IV SOSY
PREFILLED_SYRINGE | INTRAVENOUS | Status: DC | PRN
  Administered 2018-10-23: 60 mg via INTRAVENOUS

## 2018-10-23 MED ORDER — MEPERIDINE HCL 50 MG/ML IJ SOLN: 6.2500 mg | INTRAMUSCULAR | Status: DC | PRN

## 2018-10-23 MED ORDER — ACETAMINOPHEN 500 MG PO TABS
1000.0000 mg | ORAL_TABLET | ORAL | Status: AC
  Administered 2018-10-23: 1000 mg via ORAL

## 2018-10-23 MED ORDER — HEPARIN SODIUM (PORCINE) 5000 UNIT/ML IJ SOLN
5000.0000 [IU] | INTRAMUSCULAR | Status: AC
  Administered 2018-10-23: 5000 [IU] via SUBCUTANEOUS

## 2018-10-23 MED ORDER — FENTANYL CITRATE (PF) 100 MCG/2ML IJ SOLN
INTRAMUSCULAR | Status: AC
  Filled 2018-10-23: qty 2

## 2018-10-23 MED ORDER — SEVOFLURANE IN SOLN
RESPIRATORY_TRACT | Status: AC
  Filled 2018-10-23: qty 250

## 2018-10-23 MED ORDER — MORPHINE SULFATE (PF) 4 MG/ML IV SOLN: 1.0000 mg | INTRAVENOUS | Status: DC | PRN

## 2018-10-23 MED ORDER — OXYCODONE HCL 5 MG PO TABS: 5.0000 mg | ORAL_TABLET | ORAL | 0 refills | Status: AC | PRN

## 2018-10-23 MED ORDER — MIDAZOLAM HCL 2 MG/2ML IJ SOLN
INTRAMUSCULAR | Status: AC
  Filled 2018-10-23: qty 2

## 2018-10-23 MED ORDER — LACTATED RINGERS IV SOLN
INTRAVENOUS | Status: DC
  Administered 2018-10-23 (×2): via INTRAVENOUS

## 2018-10-23 MED ORDER — GABAPENTIN 800 MG PO TABS: 800.0000 mg | ORAL_TABLET | Freq: Every day | ORAL | 2 refills | Status: AC

## 2018-10-23 MED ORDER — OXYCODONE HCL 5 MG PO TABS
ORAL_TABLET | ORAL | Status: AC
  Administered 2018-10-23: 5 mg via ORAL
  Filled 2018-10-23: qty 1

## 2018-10-23 MED ORDER — IBUPROFEN 800 MG PO TABS: 800.0000 mg | ORAL_TABLET | Freq: Four times a day (QID) | ORAL | 1 refills | Status: AC

## 2018-10-23 MED ORDER — LACTATED RINGERS IV SOLN: INTRAVENOUS | Status: DC

## 2018-10-23 MED ORDER — OXYCODONE HCL 5 MG/5ML PO SOLN: 5.0000 mg | Freq: Once | ORAL | Status: AC | PRN

## 2018-10-23 MED ORDER — DEXAMETHASONE SODIUM PHOSPHATE 10 MG/ML IJ SOLN
INTRAMUSCULAR | Status: DC | PRN
  Administered 2018-10-23: 10 mg via INTRAVENOUS
  Administered 2018-10-23: 8 mg via INTRAVENOUS

## 2018-10-23 MED ORDER — KETOROLAC TROMETHAMINE 30 MG/ML IJ SOLN
30.0000 mg | Freq: Four times a day (QID) | INTRAMUSCULAR | Status: DC
  Administered 2018-10-23: 30 mg via INTRAVENOUS
  Filled 2018-10-23: qty 1

## 2018-10-23 MED ORDER — ROCURONIUM BROMIDE 100 MG/10ML IV SOLN
INTRAVENOUS | Status: DC | PRN
  Administered 2018-10-23: 10 mg via INTRAVENOUS
  Administered 2018-10-23: 50 mg via INTRAVENOUS
  Administered 2018-10-23: 10 mg via INTRAVENOUS

## 2018-10-23 MED ORDER — FENTANYL CITRATE (PF) 100 MCG/2ML IJ SOLN
INTRAMUSCULAR | Status: AC
  Administered 2018-10-23: 50 ug via INTRAVENOUS
  Filled 2018-10-23: qty 2

## 2018-10-23 MED ORDER — FENTANYL CITRATE (PF) 100 MCG/2ML IJ SOLN
25.0000 ug | INTRAMUSCULAR | Status: DC | PRN
  Administered 2018-10-23 (×2): 50 ug via INTRAVENOUS

## 2018-10-23 MED ORDER — ACETAMINOPHEN 650 MG RE SUPP
650.0000 mg | RECTAL | Status: DC | PRN
  Filled 2018-10-23: qty 1

## 2018-10-23 MED ORDER — GABAPENTIN 300 MG PO CAPS
600.0000 mg | ORAL_CAPSULE | ORAL | Status: AC
  Administered 2018-10-23: 08:00:00 600 mg via ORAL

## 2018-10-23 MED ORDER — SCOPOLAMINE 1 MG/3DAYS TD PT72
1.0000 | MEDICATED_PATCH | TRANSDERMAL | Status: DC
  Administered 2018-10-23: 1.5 mg via TRANSDERMAL

## 2018-10-23 MED ORDER — PROMETHAZINE HCL 25 MG/ML IJ SOLN: 6.2500 mg | INTRAMUSCULAR | Status: DC | PRN

## 2018-10-23 MED ORDER — BUPIVACAINE HCL 0.5 % IJ SOLN
INTRAMUSCULAR | Status: DC | PRN
  Administered 2018-10-23: 10 mL

## 2018-10-23 MED ORDER — ENSURE PRE-SURGERY PO LIQD
296.0000 mL | Freq: Once | ORAL | Status: DC
  Filled 2018-10-23: qty 296

## 2018-10-23 MED ORDER — MIDAZOLAM HCL 2 MG/2ML IJ SOLN
INTRAMUSCULAR | Status: DC | PRN
  Administered 2018-10-23: 2 mg via INTRAVENOUS

## 2018-10-23 MED ORDER — SUGAMMADEX SODIUM 200 MG/2ML IV SOLN
INTRAVENOUS | Status: DC | PRN
  Administered 2018-10-23: 200 mg via INTRAVENOUS

## 2018-10-23 MED ORDER — FENTANYL CITRATE (PF) 100 MCG/2ML IJ SOLN
INTRAMUSCULAR | Status: DC | PRN
  Administered 2018-10-23 (×4): 50 ug via INTRAVENOUS

## 2018-10-23 MED ORDER — ONDANSETRON HCL 4 MG/2ML IJ SOLN
INTRAMUSCULAR | Status: DC | PRN
  Administered 2018-10-23: 4 mg via INTRAVENOUS

## 2018-10-23 MED ORDER — DEXAMETHASONE SODIUM PHOSPHATE 4 MG/ML IJ SOLN
4.0000 mg | INTRAMUSCULAR | Status: AC
  Administered 2018-10-23: 4 mg via INTRAVENOUS
  Filled 2018-10-23: qty 1

## 2018-10-23 MED ORDER — CEFAZOLIN SODIUM-DEXTROSE 2-4 GM/100ML-% IV SOLN
2.0000 g | INTRAVENOUS | Status: AC
  Administered 2018-10-23: 2 g via INTRAVENOUS

## 2018-10-23 MED ORDER — KETOROLAC TROMETHAMINE 30 MG/ML IJ SOLN
INTRAMUSCULAR | Status: AC
  Filled 2018-10-23: qty 1

## 2018-10-23 MED ORDER — OXYCODONE HCL 5 MG PO TABS
5.0000 mg | ORAL_TABLET | Freq: Once | ORAL | Status: AC | PRN
  Administered 2018-10-23: 5 mg via ORAL

## 2018-10-23 MED ORDER — CELECOXIB 200 MG PO CAPS
400.0000 mg | ORAL_CAPSULE | ORAL | Status: AC
  Administered 2018-10-23: 400 mg via ORAL

## 2018-10-23 MED ORDER — PROPOFOL 10 MG/ML IV BOLUS
INTRAVENOUS | Status: DC | PRN
  Administered 2018-10-23: 200 mg via INTRAVENOUS

## 2018-10-23 SURGICAL SUPPLY — 55 items
BAG URINE DRAINAGE (UROLOGICAL SUPPLIES) ×5 IMPLANT
BASIN GRAD PLASTIC 32OZ STRL (MISCELLANEOUS) ×5 IMPLANT
BLADE SURG SZ11 CARB STEEL (BLADE) ×7 IMPLANT
CANISTER SUCT 1200ML W/VALVE (MISCELLANEOUS) ×5 IMPLANT
CATH FOLEY 2WAY  5CC 16FR (CATHETERS) ×2
CATH URTH 16FR FL 2W BLN LF (CATHETERS) ×3 IMPLANT
CHLORAPREP W/TINT 26 (MISCELLANEOUS) ×5 IMPLANT
COVER WAND RF STERILE (DRAPES) ×5 IMPLANT
DEFOGGER SCOPE WARMER CLEARIFY (MISCELLANEOUS) ×5 IMPLANT
DERMABOND ADVANCED (GAUZE/BANDAGES/DRESSINGS) ×2
DERMABOND ADVANCED .7 DNX12 (GAUZE/BANDAGES/DRESSINGS) ×3 IMPLANT
DRAPE LEGGINS SURG 28X43 STRL (DRAPES) ×5 IMPLANT
DRAPE SHEET LG 3/4 BI-LAMINATE (DRAPES) ×5 IMPLANT
DRAPE UNDER BUTTOCK W/FLU (DRAPES) ×5 IMPLANT
GLOVE PI ORTHOPRO 6.5 (GLOVE) ×2
GLOVE PI ORTHOPRO STRL 6.5 (GLOVE) ×3 IMPLANT
GLOVE SURG SYN 6.5 ES PF (GLOVE) ×10 IMPLANT
GLOVE SURG SYN 6.5 PF PI (GLOVE) ×6 IMPLANT
GOWN STRL REUS W/ TWL LRG LVL3 (GOWN DISPOSABLE) ×9 IMPLANT
GOWN STRL REUS W/ TWL XL LVL3 (GOWN DISPOSABLE) ×3 IMPLANT
GOWN STRL REUS W/TWL LRG LVL3 (GOWN DISPOSABLE) ×6
GOWN STRL REUS W/TWL XL LVL3 (GOWN DISPOSABLE) ×2
GRASPER SUT TROCAR 14GX15 (MISCELLANEOUS) ×5 IMPLANT
IRRIGATION STRYKERFLOW (MISCELLANEOUS) ×3 IMPLANT
IRRIGATOR STRYKERFLOW (MISCELLANEOUS) ×5
IV LACTATED RINGERS 1000ML (IV SOLUTION) ×5 IMPLANT
KIT PINK PAD W/HEAD ARE REST (MISCELLANEOUS) ×5
KIT PINK PAD W/HEAD ARM REST (MISCELLANEOUS) ×3 IMPLANT
KIT TURNOVER CYSTO (KITS) ×5 IMPLANT
L-HOOK LAP DISP 36CM (ELECTROSURGICAL) ×5
LABEL OR SOLS (LABEL) ×5 IMPLANT
LHOOK LAP DISP 36CM (ELECTROSURGICAL) ×3 IMPLANT
LIGASURE VESSEL 5MM BLUNT TIP (ELECTROSURGICAL) ×5 IMPLANT
MORCELLATOR XCISE  COR (MISCELLANEOUS)
MORCELLATOR XCISE COR (MISCELLANEOUS) ×3 IMPLANT
NS IRRIG 500ML POUR BTL (IV SOLUTION) ×5 IMPLANT
PACK LAP CHOLECYSTECTOMY (MISCELLANEOUS) ×5 IMPLANT
PAD OB MATERNITY 4.3X12.25 (PERSONAL CARE ITEMS) ×5 IMPLANT
PAD PREP 24X41 OB/GYN DISP (PERSONAL CARE ITEMS) ×5 IMPLANT
PENCIL ELECTRO HAND CTR (MISCELLANEOUS) ×5 IMPLANT
POUCH ENDO CATCH 10MM SPEC (MISCELLANEOUS) ×2 IMPLANT
RETRACTOR RING XSMALL (MISCELLANEOUS) IMPLANT
RTRCTR WOUND ALEXIS 13CM XS SH (MISCELLANEOUS) ×5
SET TUBE SMOKE EVAC HIGH FLOW (TUBING) ×5 IMPLANT
SLEEVE ENDOPATH XCEL 5M (ENDOMECHANICALS) ×10 IMPLANT
SUT MNCRL 4-0 (SUTURE) ×2
SUT MNCRL 4-0 27XMFL (SUTURE) ×3
SUT MNCRL AB 4-0 PS2 18 (SUTURE) ×5 IMPLANT
SUT VIC AB 0 CT1 36 (SUTURE) ×5 IMPLANT
SUT VICRYL 0 AB UR-6 (SUTURE) ×2 IMPLANT
SUT VICRYL 0 UR6 27IN ABS (SUTURE) ×2 IMPLANT
SUTURE MNCRL 4-0 27XMF (SUTURE) ×3 IMPLANT
TROCAR ENDO BLADELESS 11MM (ENDOMECHANICALS) ×5 IMPLANT
TROCAR XCEL NON-BLD 5MMX100MML (ENDOMECHANICALS) ×5 IMPLANT
TUBING EVAC SMOKE HEATED PNEUM (TUBING) ×5 IMPLANT

## 2018-10-23 NOTE — Anesthesia Preprocedure Evaluation (Signed)
Anesthesia Evaluation  Patient identified by MRN, date of birth, ID band Patient awake    Reviewed: Allergy & Precautions, NPO status , Patient's Chart, lab work & pertinent test results  History of Anesthesia Complications Negative for: history of anesthetic complications  Airway Mallampati: II  TM Distance: >3 FB Neck ROM: Full    Dental  (+) Poor Dentition   Pulmonary neg COPD, Current Smoker,    breath sounds clear to auscultation- rhonchi (-) wheezing      Cardiovascular Exercise Tolerance: Good (-) hypertension(-) CAD, (-) Past MI, (-) Cardiac Stents and (-) CABG  Rhythm:Regular Rate:Normal - Systolic murmurs and - Diastolic murmurs    Neuro/Psych neg Seizures Anxiety negative neurological ROS     GI/Hepatic Neg liver ROS, GERD  ,  Endo/Other  negative endocrine ROSneg diabetes  Renal/GU negative Renal ROS     Musculoskeletal  (+) Arthritis ,   Abdominal (+) + obese,   Peds  Hematology negative hematology ROS (+)   Anesthesia Other Findings Past Medical History: No date: Anxiety No date: Arthritis     Comment:  knees No date: GERD (gastroesophageal reflux disease) No date: Hypophosphatemia     Comment:  FAMILIAL No date: IBS (irritable bowel syndrome) No date: Pain     Comment:  CHRONIC LEG PAIN No date: PCOS (polycystic ovarian syndrome) No date: Pre-diabetes     Comment:  GESTATIONAL DM No date: Thyromegaly No date: TMJ (dislocation of temporomandibular joint)     Comment:  H/O   Reproductive/Obstetrics                             Anesthesia Physical Anesthesia Plan  ASA: II  Anesthesia Plan: General   Post-op Pain Management:    Induction: Intravenous  PONV Risk Score and Plan: 1 and Midazolam and Ondansetron  Airway Management Planned: Oral ETT  Additional Equipment:   Intra-op Plan:   Post-operative Plan: Extubation in OR  Informed Consent: I have  reviewed the patients History and Physical, chart, labs and discussed the procedure including the risks, benefits and alternatives for the proposed anesthesia with the patient or authorized representative who has indicated his/her understanding and acceptance.     Dental advisory given  Plan Discussed with: CRNA and Anesthesiologist  Anesthesia Plan Comments:         Anesthesia Quick Evaluation

## 2018-10-23 NOTE — Anesthesia Post-op Follow-up Note (Signed)
Anesthesia QCDR form completed.        

## 2018-10-23 NOTE — H&P (Signed)
Preoperative History and Physical  Susan Clay is a 33 y.o.  here for surgical management of dysfunctional uterine bleeding.   No significant preoperative concerns.   Proposed surgery: Supracervical hysterectomy, bilateral salpingectomy, removal of intrauterine device.  Past Medical History:  Diagnosis Date  . Anxiety   . Arthritis    knees  . GERD (gastroesophageal reflux disease)   . Hypophosphatemia    FAMILIAL  . IBS (irritable bowel syndrome)   . Pain    CHRONIC LEG PAIN  . PCOS (polycystic ovarian syndrome)   . Pre-diabetes    GESTATIONAL DM  . Thyromegaly   . TMJ (dislocation of temporomandibular joint)    H/O   Past Surgical History:  Procedure Laterality Date  . BRONCHOSCOPY    . CESAREAN SECTION     X 2  . CHOLECYSTECTOMY N/A 10/10/2017   Procedure: LAPAROSCOPIC CHOLECYSTECTOMY;  Surgeon: Carolan Shiverintron-Diaz, Edgardo, MD;  Location: [facility] ORS;  Service: General;  Laterality: N/A;  . COLONOSCOPY    . FOOT SURGERY     BUNION  . OSTEOTOMY FEMUR / SHAFT / SUPRACONDYLAR W/ FIXATION     MULTIPLE AS A CHILD  . TUBAL LIGATION     OB History  No obstetric history on file.  Patient denies any other pertinent gynecologic issues.   No current facility-administered medications on file prior to encounter.    Current Outpatient Medications on File Prior to Encounter  Medication Sig Dispense Refill  . Aspirin-Salicylamide-Caffeine (BC HEADACHE POWDER PO) Take 1 packet by mouth daily as needed (headaches).    . meloxicam (MOBIC) 15 MG tablet Take 15 mg by mouth as needed for pain.    . pantoprazole (PROTONIX) 40 MG tablet Take 40 mg by mouth every morning.     . Probiotic CAPS Take 1 capsule by mouth daily.    Marland Kitchen. tobramycin (TOBREX) 0.3 % ophthalmic solution Place 1 drop into the right eye 4 (four) times daily.     Allergies  Allergen Reactions  . Phenylephrine Hcl Hives and Itching    Social History:   reports that she has been smoking cigarettes. She has a 3.25  pack-year smoking history. She has never used smokeless tobacco. She reports current alcohol use. She reports that she does not use drugs.  History reviewed. No pertinent family history.  Review of Systems: Noncontributory  PHYSICAL EXAM: Blood pressure 115/84, pulse 99, temperature 98.5 F (36.9 C), temperature source Oral, resp. rate 18, height 5\' 2"  (1.575 m), weight 97.7 kg, SpO2 100 %. General appearance - alert, well appearing, and in no distress Chest - clear to auscultation, no wheezes, rales or rhonchi, symmetric air entry Heart - normal rate and regular rhythm Abdomen - soft, nontender, nondistended, no masses or organomegaly Pelvic - examination not indicated Extremities - peripheral pulses normal, no pedal edema, no clubbing or cyanosis  Labs: Results for orders placed or performed during the hospital encounter of 10/23/18 (from the past 336 hour(s))  CBC   Collection Time: 10/23/18  8:15 AM  Result Value Ref Range   WBC 11.3 (H) 4.0 - 10.5 K/uL   RBC 4.32 3.87 - 5.11 MIL/uL   Hemoglobin 12.7 12.0 - 15.0 g/dL   HCT 16.138.5 09.636.0 - 04.546.0 %   MCV 89.1 80.0 - 100.0 fL   MCH 29.4 26.0 - 34.0 pg   MCHC 33.0 30.0 - 36.0 g/dL   RDW 40.913.8 81.111.5 - 91.415.5 %   Platelets 297 150 - 400 K/uL   nRBC 0.0 0.0 -  0.2 %  Basic metabolic panel   Collection Time: 10/23/18  8:15 AM  Result Value Ref Range   Sodium 139 135 - 145 mmol/L   Potassium 3.6 3.5 - 5.1 mmol/L   Chloride 106 98 - 111 mmol/L   CO2 24 22 - 32 mmol/L   Glucose, Bld 82 70 - 99 mg/dL   BUN 13 6 - 20 mg/dL   Creatinine, Ser 6.94 0.44 - 1.00 mg/dL   Calcium 8.9 8.9 - 85.4 mg/dL   GFR calc non Af Amer >60 >60 mL/min   GFR calc Af Amer >60 >60 mL/min   Anion gap 9 5 - 15  Pregnancy, urine POC   Collection Time: 10/23/18  8:15 AM  Result Value Ref Range   Preg Test, Ur NEGATIVE NEGATIVE  Type and screen [facility]   Collection Time: 10/23/18  8:15 AM  Result Value Ref Range   ABO/RH(D) O POS     Antibody Screen NEG    Sample Expiration      10/26/2018,2359 Performed at [facility], [address] [place], [place] [address]   ABO/Rh   Collection Time: 10/23/18  8:25 AM  Result Value Ref Range   ABO/RH(D)      O POS Performed at [facility], [address] [place], [place] [address]   Results for orders placed or performed during the hospital encounter of 10/20/18 (from the past 336 hour(s))  Novel Coronavirus, NAA (hospital order; send-out to ref lab)   Collection Time: 10/20/18 11:25 AM  Result Value Ref Range   SARS-CoV-2, NAA NOT DETECTED NOT DETECTED   Coronavirus Source NASOPHARYNGEAL     Assessment: Patient Active Problem List   Diagnosis Date Noted  . DUB (dysfunctional uterine bleeding) 10/23/2018    Plan: Patient will undergo surgical management with SLH, BS, remove IUD.   The risks of surgery were discussed in detail with the patient including but not limited to: bleeding which may require transfusion or reoperation; infection which may require antibiotics; injury to surrounding organs which may involve bowel, bladder, ureters ; need for additional procedures including laparoscopy or laparotomy; thromboembolic phenomenon, surgical site problems and other postoperative/anesthesia complications. Likelihood of success in alleviating the patient's condition was discussed. Routine postoperative instructions will be reviewed with the patient and her family in detail after surgery.  The patient concurred with the proposed plan, giving informed written consent for the surgery.  Patient has been NPO since last night she will remain NPO for procedure.  Anesthesia and OR aware.  Preoperative prophylactic antibiotics and SCDs ordered on call to the OR.  To OR when ready.  ----- Ranae Plumber, MD, FACOG Attending Obstetrician and Gynecologist [facility], Department of OB/GYN [facility] . 10/23/2018 9:56 AM

## 2018-10-23 NOTE — Transfer of Care (Signed)
Immediate Anesthesia Transfer of Care Note  Patient: Susan Clay  Procedure(s) Performed: LAPAROSCOPIC SUPRACERVICAL HYSTERECTOMY (N/A ) LAPAROSCOPIC BILATERAL SALPINGECTOMY (Bilateral ) INTRAUTERINE DEVICE (IUD) REMOVAL  Patient Location: PACU  Anesthesia Type:General  Level of Consciousness: sedated  Airway & Oxygen Therapy: Patient Spontanous Breathing and Patient connected to face mask oxygen  Post-op Assessment: Report given to RN and Post -op Vital signs reviewed and stable  Post vital signs: Reviewed and stable  Last Vitals:  Vitals Value Taken Time  BP 129/94 10/23/2018  1:15 PM  Temp 36.6 C 10/23/2018  1:15 PM  Pulse 92 10/23/2018  1:17 PM  Resp 12 10/23/2018  1:17 PM  SpO2 99 % 10/23/2018  1:17 PM  Vitals shown include unvalidated device data.  Last Pain:  Vitals:   10/23/18 0810  TempSrc: Oral  PainSc: 0-No pain         Complications: No apparent anesthesia complications

## 2018-10-23 NOTE — OR Nursing (Signed)
Lab tech in for draw [phone]

## 2018-10-23 NOTE — Op Note (Addendum)
Supracervical Laparoscopic Hysterectomy Operative Note Procedure Date: 10/23/2018  Patient:  Susan Clay  33 y.o. female  PRE-OPERATIVE DIAGNOSIS:  abnormal uterine bleeding, pelvic pain  POST-OPERATIVE DIAGNOSIS:  abnormal uterine bleeding, pelvic congestion syndrome  PROCEDURE:  Procedure(s): LAPAROSCOPIC SUPRACERVICAL HYSTERECTOMY (N/A) LAPAROSCOPIC BILATERAL SALPINGECTOMY (Bilateral) INTRAUTERINE DEVICE (IUD) REMOVAL  SURGEON:  Surgeon(s) and Role:    * Kimberlin Scheel, Elenora Fenderhelsea C, MD - Primary    * Schermerhorn, Ihor Austinhomas J, MD - Assisting  ANESTHESIA:  General via ET  I/O  Total I/O In: 1400 [I.V.:1300; IV Piggyback:100] Out: 817 [Urine:817]  FINDINGS:  Normal sized, boggy uterus densely adherent to anterior abdominal wall (pelvis). Omentum adherent to left ovary and anterior abdominal wall (pelvic brim to surperior periumbilical). Normal ovaries and fallopian tubes bilaterally.  Dilated and duplicated venous plexus bilateral adnexa.  SPECIMEN: Uterus, Cervix, and bilateral fallopian tubes  COMPLICATIONS: none apparent  DISPOSITION: vital signs stable to PACU  Indication for Surgery: 33 y.o. with chronic dysmenorrhea, and dysfunctional bleeding unresponsive to hormonal manipulation, who desires definitive treatment.  Risks of surgery were discussed with the patient including but not limited to: bleeding which may require transfusion or reoperation; infection which may require antibiotics; injury to bowel, bladder, ureters or other surrounding organs; need for additional procedures including laparotomy, blood clot, incisional problems and other postoperative/anesthesia complications. Written informed consent was obtained.      PROCEDURE IN DETAIL:  The patient had 5000u Heparin Sub-q and sequential compression devices applied to her lower extremities while in the preoperative area.  She was then taken to the operating room. IV antibiotics were given. General anesthesia was administered  via endotracheal route.  She was placed in the dorsal lithotomy position, and was prepped and draped in a sterile manner. A surgical time-out was performed.  A Foley catheter was inserted into her bladder and attached to constant drainage.  The speculum was placed in the vagina, the IUD strings visualized and grasped and removed without difficulty.  A uterine sound was then advanced into the uterus and affixed to a single tooth tenaculum to act as a uterine manipulator. The gloves were changed, and attention was turned to the abdomen where an umbilical incision was made with the scalpel.  A 5mm trochar was inserted in the umbilical incision using a visiport method.Opening pressure was 8mmHg, and the abdomen was insufflated to 15mmHg carbon dioxide gas and adequate pneumoperitoneum was obtained. A survey of the patient's pelvis and abdomen revealed the findings as mentioned above. Two 5mm ports were inserted in the lower left and right quadrants under visualization.    The ligasure was used to ligate the omental adhesions to the anterior abdominal wall after noting there was no bowel involvement.  The Bovie Hook was used to separate the uterus from the anterior abdominal/pelvic wall and bladder peritoneum.  Bilateral ureters were visualized vermiuclating. The bilateral fallopian tubes were separated from the mesosalpinx using the Ligasure. The bilateral round ligaments were transected and anterior broad ligament divided and brought across the uterus to separate the vesicouterine peritoneum and create a bladder flap. The bladder was pushed away from the uterus. The bilateral uterine arteries were skeletonized, ligated and transected. The bilateral uterosacral and cardinal ligaments were ligated and transected.The uterus was transected from the cervix using the bovie, and after the uterus was removed, the bovie was used to cauterize the cervical canal.  The 5mm umbilical port was exchanged for an 11mm port and  the uterus was placed in an endocatch bag. The  pressure was dropped and the  surgical site inspected, and found to be hemostatic.Marland Kitchen No intraoperative injury to surrounding organs was noted.The umbilical port site was enlarged, and the endocatch bag was brought through the incision.  An Alexis retractor was placed within the bag and the uterus was grasped and using the EXCITE technique, was removed with an 11-blade.  The bag and retractor were then removed.  The umbilical incision was closed with 0-vicryl. All instruments were then removed.   All skin incisions were closed with 4-0 monocryl and covered with surgical glue. The patient tolerated the procedures well.  All instruments, needles, and sponge counts were correct x 2. The patient was taken to the recovery room in stable condition.   ---- Ranae Plumber, MD Attending Obstetrician and Gynecologist [facility]

## 2018-10-23 NOTE — Discharge Instructions (Signed)
Discharge instructions:  Call office if you have any of the following: fever >101 F, chills, shortness of breath, excessive vaginal bleeding, incision drainage or problems, leg pain or redness, or any other concerns.   Activity: Do not lift > 10 lbs for 8 weeks.  No intercourse or tampons for 8 weeks.  No driving until you are certain you can slam on the brakes.   You may feel some pain in your upper right abdomen/rib and right shoulder.  This is from the gas in the abdomen for surgery. This will subside over time, please be patient!  Take 800mg  Ibuprofen and 1000mg  Tylenol around the clock, every 6 hours for at least the first 3-5 days.  After this you can take as needed.  This will help decrease inflammation and promote healing.  The narcotics you'll take just as needed, as they just trick your brain into thinking its not in pain.    Please don't limit yourself in terms of routine activity.  You will be able to do most things, although they may take longer to do or be a little painful.  You can do it!  Don't be a hero, but don't be a wimp either! --Dr. Elesa Massed    AMBULATORY SURGERY  DISCHARGE INSTRUCTIONS   1) The drugs that you were given will stay in your system until tomorrow so for the next 24 hours you should not:  A) Drive an automobile B) Make any legal decisions C) Drink any alcoholic beverage   2) You may resume regular meals tomorrow.  Today it is better to start with liquids and gradually work up to solid foods.  You may eat anything you prefer, but it is better to start with liquids, then soup and crackers, and gradually work up to solid foods.   3) Please notify your doctor immediately if you have any unusual bleeding, trouble breathing, redness and pain at the surgery site, drainage, fever, or pain not relieved by medication.    4) Additional Instructions:        Please contact your physician with any problems or Same Day Surgery at [phone], Monday  through Friday 6 am to 4 pm, or [place] at [facility] number at [phone].

## 2018-10-23 NOTE — Anesthesia Procedure Notes (Signed)
Procedure Name: Intubation Date/Time: 10/23/2018 10:43 AM Performed by: Allean Found, CRNA Pre-anesthesia Checklist: Patient identified, Patient being monitored, Timeout performed, Emergency Drugs available and Suction available Patient Re-evaluated:Patient Re-evaluated prior to induction Oxygen Delivery Method: Circle system utilized Preoxygenation: Pre-oxygenation with 100% oxygen Induction Type: IV induction Ventilation: Mask ventilation without difficulty Laryngoscope Size: Mac and 3 Grade View: Grade I Tube type: Oral Tube size: 7.0 mm Number of attempts: 1 Airway Equipment and Method: Stylet Placement Confirmation: ETT inserted through vocal cords under direct vision,  positive ETCO2 and breath sounds checked- equal and bilateral Secured at: 21 cm Tube secured with: Tape Dental Injury: Teeth and Oropharynx as per pre-operative assessment

## 2018-10-23 NOTE — Anesthesia Postprocedure Evaluation (Signed)
Anesthesia Post Note  Patient: Susan Clay  Procedure(s) Performed: LAPAROSCOPIC SUPRACERVICAL HYSTERECTOMY (N/A ) LAPAROSCOPIC BILATERAL SALPINGECTOMY (Bilateral ) INTRAUTERINE DEVICE (IUD) REMOVAL  Patient location during evaluation: PACU Anesthesia Type: General Level of consciousness: awake and alert and oriented Pain management: pain level controlled Vital Signs Assessment: post-procedure vital signs reviewed and stable Respiratory status: spontaneous breathing, nonlabored ventilation and respiratory function stable Cardiovascular status: blood pressure returned to baseline and stable Postop Assessment: no signs of nausea or vomiting Anesthetic complications: no     Last Vitals:  Vitals:   10/23/18 1415 10/23/18 1420  BP:  117/83  Pulse:  89  Resp: 12 11  Temp:  36.5 C  SpO2:  96%    Last Pain:  Vitals:   10/23/18 1420  TempSrc:   PainSc: 4                  [name]

## 2018-10-24 ENCOUNTER — Encounter: Payer: Self-pay | Admitting: Obstetrics & Gynecology

## 2018-10-27 LAB — SURGICAL PATHOLOGY

## 2019-10-05 ENCOUNTER — Other Ambulatory Visit: Payer: Self-pay

## 2019-10-05 ENCOUNTER — Ambulatory Visit
Admission: RE | Admit: 2019-10-05 | Discharge: 2019-10-05 | Disposition: A | Discharge status: Home / Self Care | Payer: Managed Care, Other (non HMO) | Source: Ambulatory Visit | Attending: Physician Assistant | Admitting: Physician Assistant

## 2019-10-05 ENCOUNTER — Other Ambulatory Visit: Payer: Self-pay | Admitting: Physician Assistant

## 2019-10-05 DIAGNOSIS — M79605 Pain in left leg: Secondary | ICD-10-CM | POA: Insufficient documentation

## 2020-03-24 ENCOUNTER — Other Ambulatory Visit: Payer: Self-pay | Admitting: Internal Medicine

## 2020-03-24 DIAGNOSIS — R0789 Other chest pain: Secondary | ICD-10-CM

## 2020-03-24 DIAGNOSIS — R1310 Dysphagia, unspecified: Secondary | ICD-10-CM

## 2020-07-14 IMAGING — US US EXTREM LOW VENOUS*L*
1 series · 14 of 24 positions shown · non-contrast
Comparison: None.

CLINICAL DATA: Left leg pain.  Pain for 2 weeks.

EXAM:
LEFT LOWER EXTREMITY VENOUS DOPPLER ULTRASOUND
TECHNIQUE: Gray-scale sonography with compression, as well as color and duplex
ultrasound, were performed to evaluate the deep venous system(s)
from the level of the common femoral vein through the popliteal and
proximal calf veins.

[Series 1: us extrem low venous*left* · 0.08mm/px · 14 of 35 slices shown]
[im 1/35]
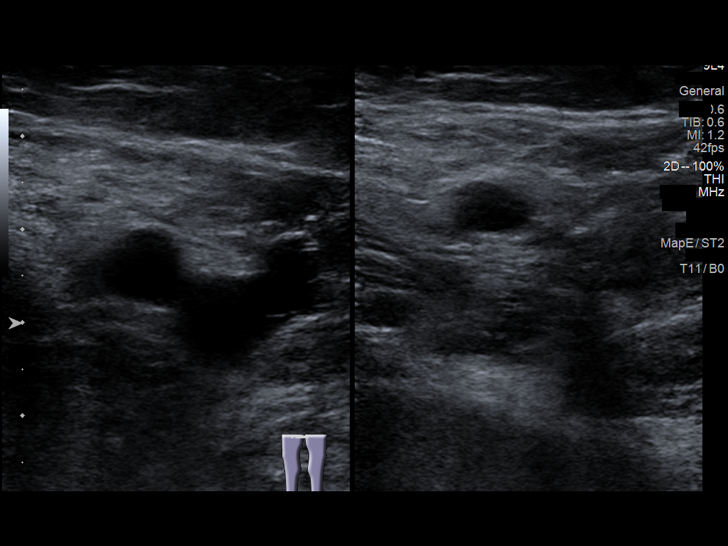
[im 3/35]
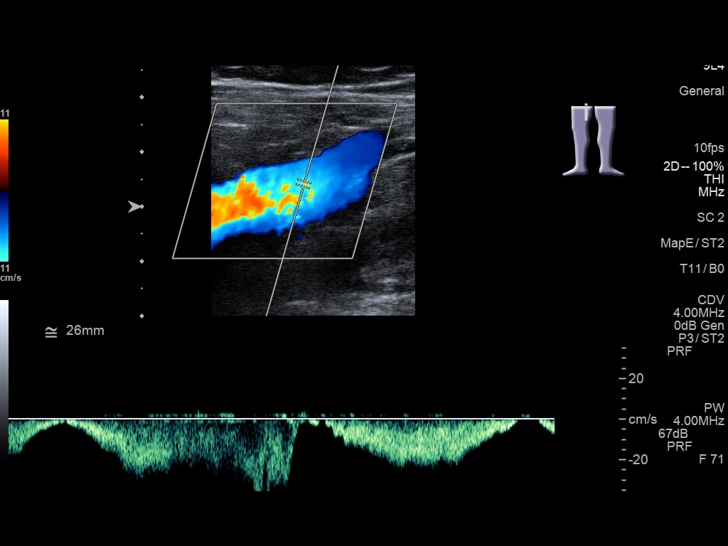
[im 6/35]
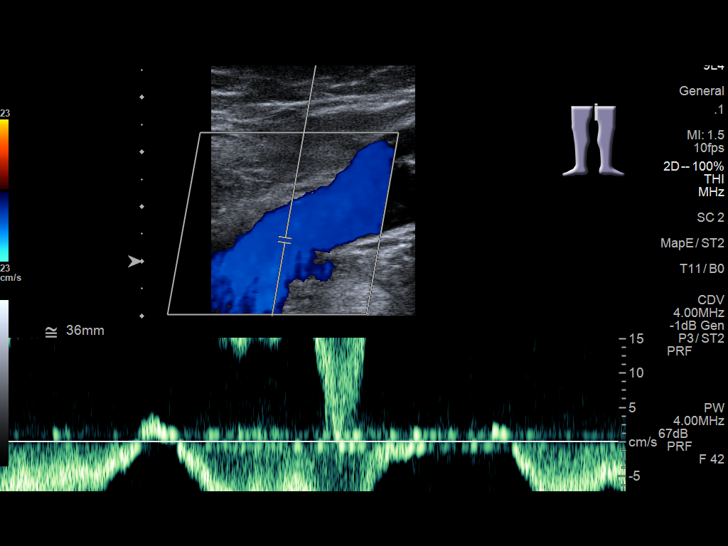
[im 9/35]
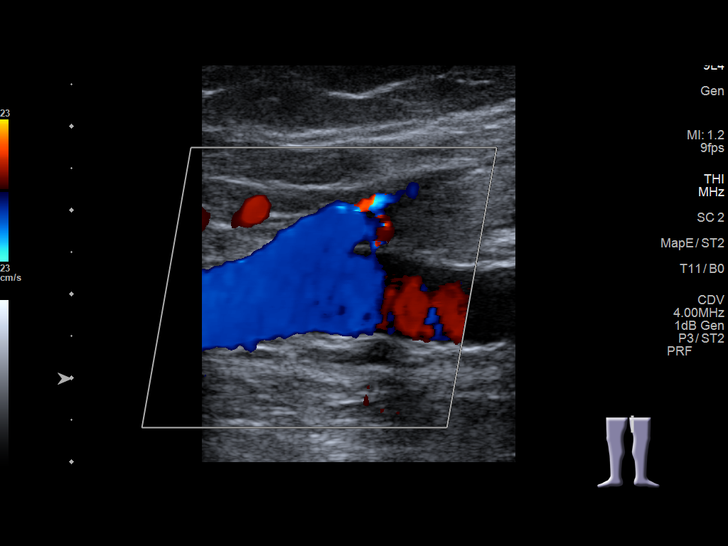
[im 11/35]
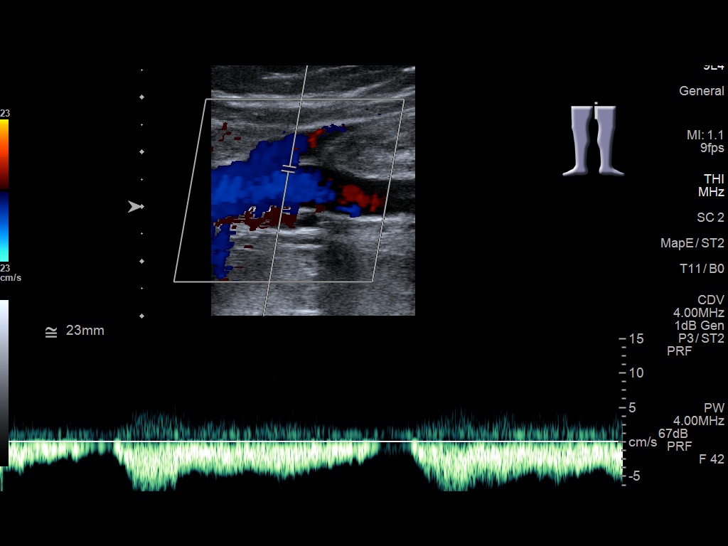
[im 14/35]
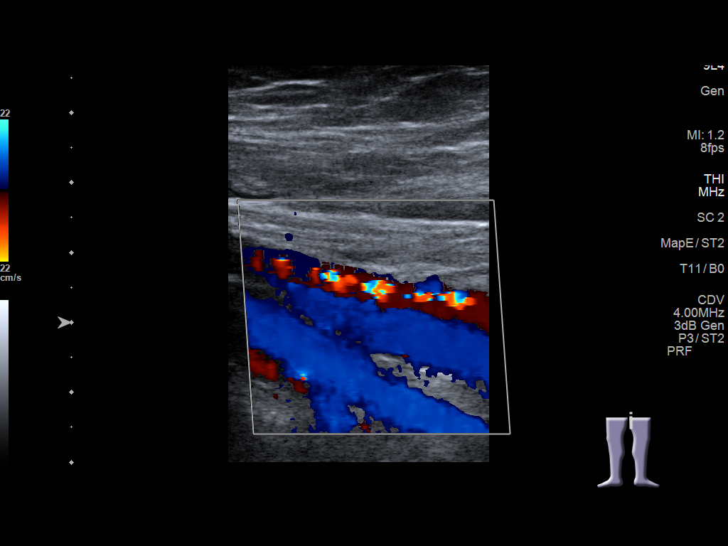
[im 17/35]
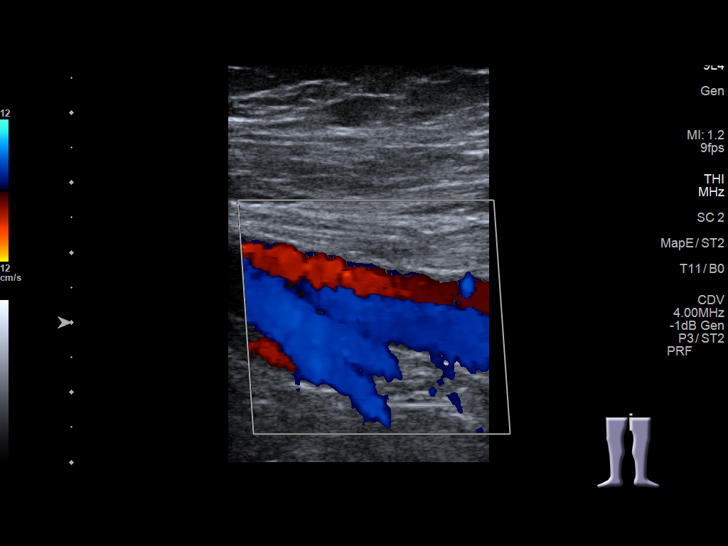
[im 18/35]
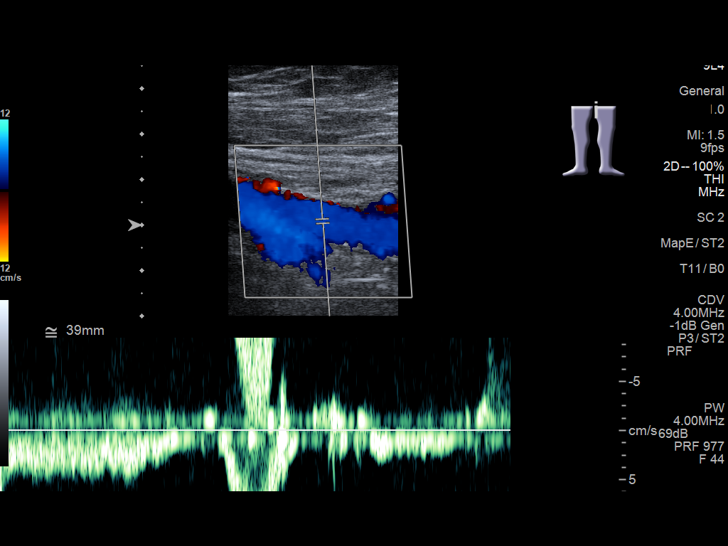
[im 21/35]
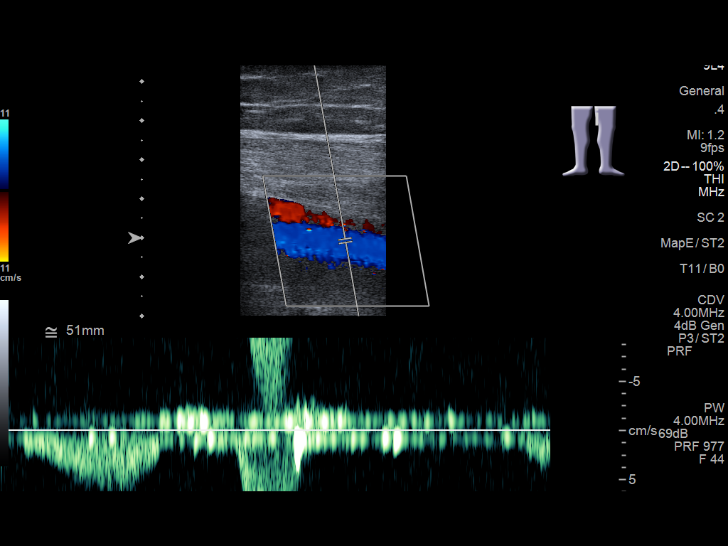
[im 24/35]
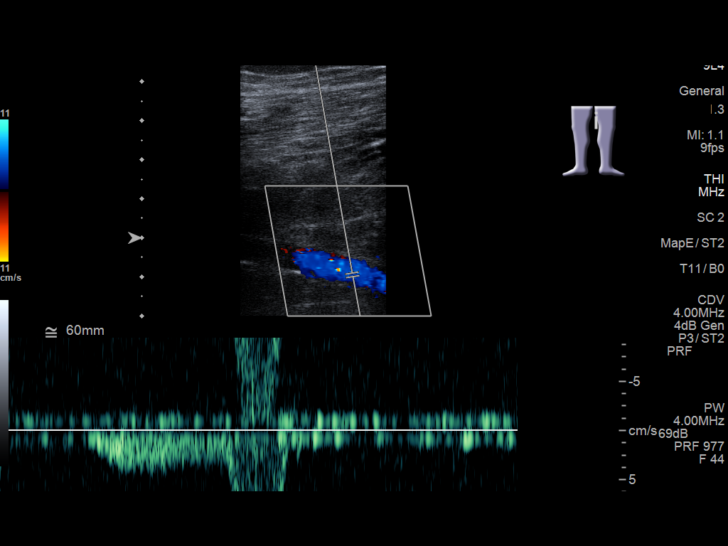
[im 27/35]
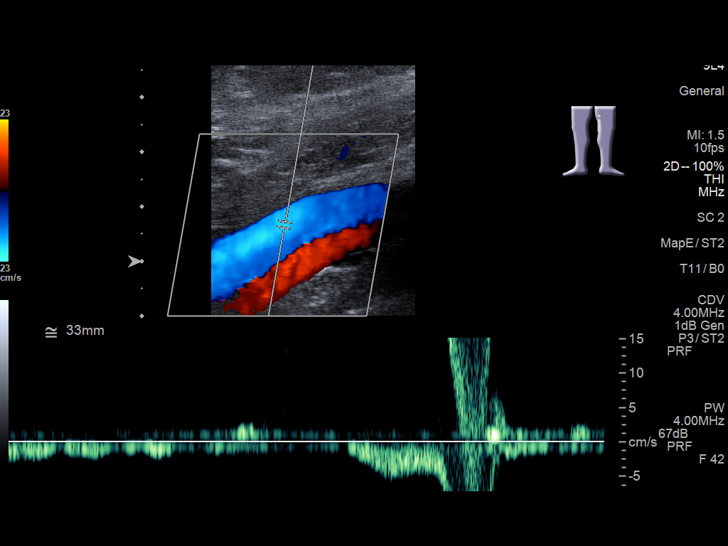
[im 29/35]
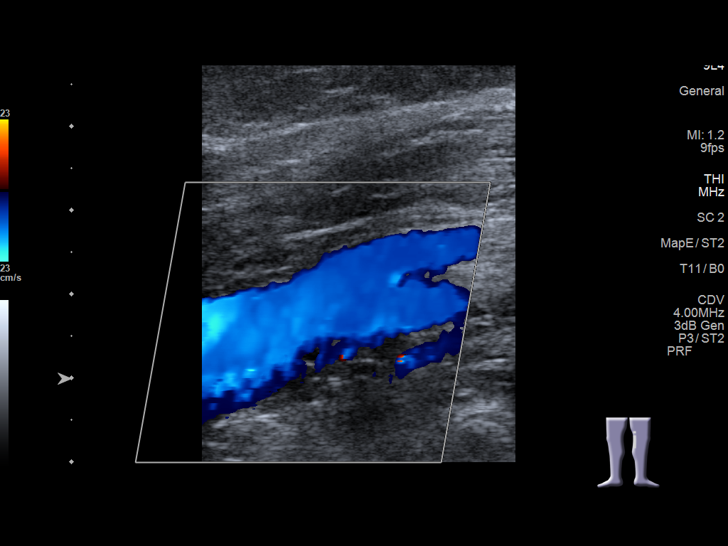
[im 32/35]
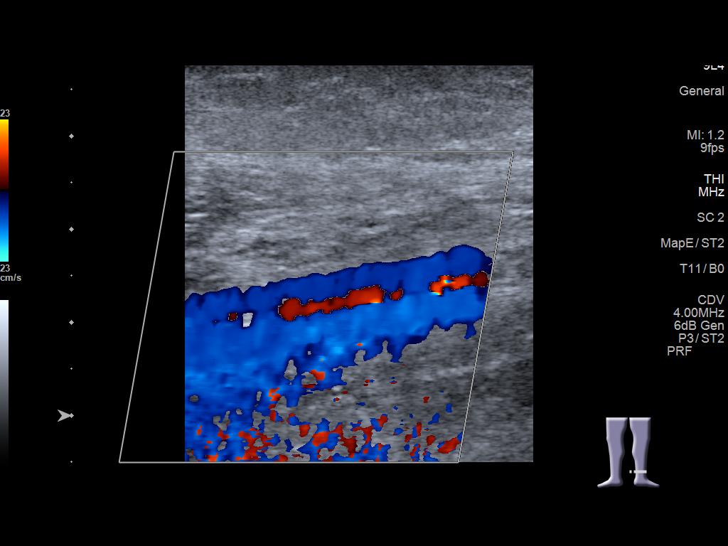
[im 35/35]
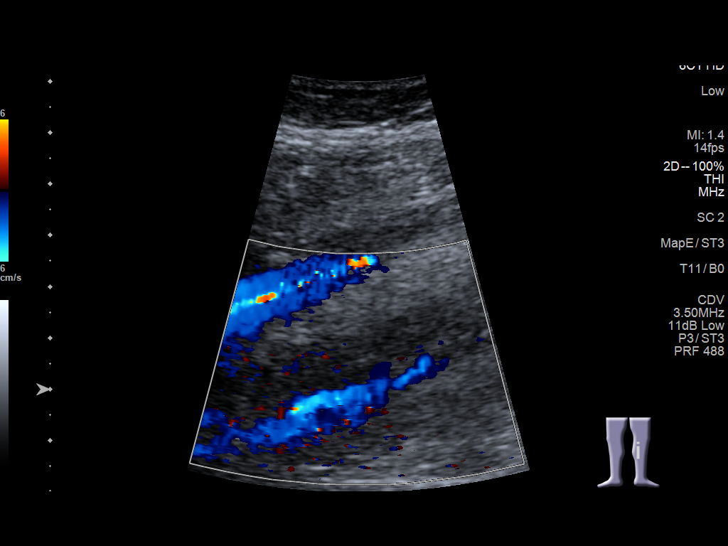

[14 of 24 positions shown; findings below may reference images not displayed]

FINDINGS: VENOUS

Normal compressibility of the common femoral, superficial femoral,
and popliteal veins, as well as the visualized calf veins.
Visualized portions of profunda femoral vein and great saphenous
vein unremarkable. No filling defects to suggest DVT on grayscale or
color Doppler imaging. Doppler waveforms show normal direction of
venous flow, normal respiratory plasticity and response to
augmentation.

Limited views of the contralateral common femoral vein are
unremarkable.

OTHER

None.

Limitations: none
IMPRESSION: No evidence of left lower extremity DVT.

## 2021-03-15 ENCOUNTER — Other Ambulatory Visit: Payer: Self-pay

## 2021-03-15 ENCOUNTER — Ambulatory Visit
Admission: RE | Admit: 2021-03-15 | Discharge: 2021-03-15 | Disposition: A | Discharge status: Home / Self Care | Payer: Managed Care, Other (non HMO) | Source: Ambulatory Visit | Attending: Family Medicine | Admitting: Family Medicine

## 2021-03-15 ENCOUNTER — Other Ambulatory Visit: Payer: Self-pay | Admitting: Family Medicine

## 2021-03-15 ENCOUNTER — Other Ambulatory Visit (HOSPITAL_COMMUNITY): Payer: Self-pay | Admitting: Family Medicine

## 2021-03-15 DIAGNOSIS — R1032 Left lower quadrant pain: Secondary | ICD-10-CM

## 2021-03-15 MED ORDER — IOHEXOL 300 MG/ML  SOLN
100.0000 mL | Freq: Once | INTRAMUSCULAR | Status: AC | PRN
  Administered 2021-03-15: 100 mL via INTRAVENOUS

## 2021-12-23 IMAGING — CT CT ABD-PELV W/ CM
2 of 4 series · 16 of 46 positions shown, 18 images · IV contrast (omnipaque)
Comparison: Abdominal ultrasound dated 10/03/2017.

CLINICAL DATA: Acute onset left lower quadrant abdominal pain.

EXAM:
CT ABDOMEN AND PELVIS WITH CONTRAST
TECHNIQUE: Multidetector CT imaging of the abdomen and pelvis was performed
using the standard protocol following bolus administration of
intravenous contrast.
CONTRAST:  100mL OMNIPAQUE IOHEXOL 300 MG/ML  SOLN

[Series 2: routine abd/pel with · axial · 0.78mm/px · z∈[-386,+44]mm · 13 of 94 slices shown, 15 images]
[im 4/94  soft-tissue]
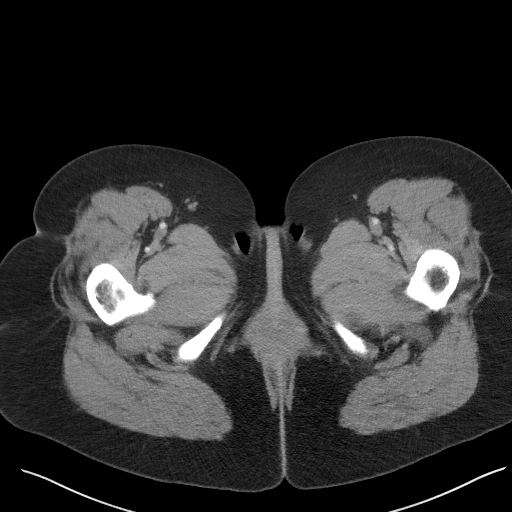
[im 4/94  bone]
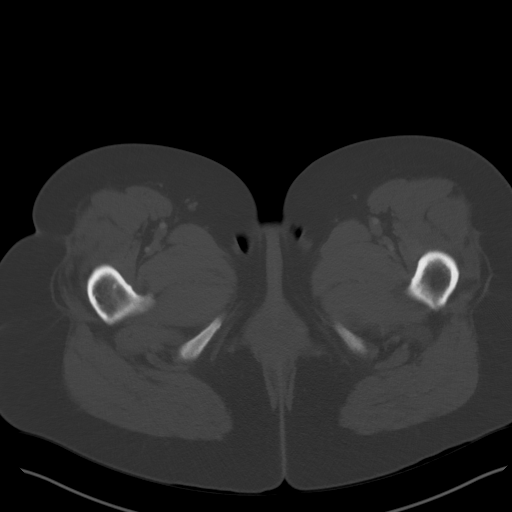
[im 12/94  soft-tissue]
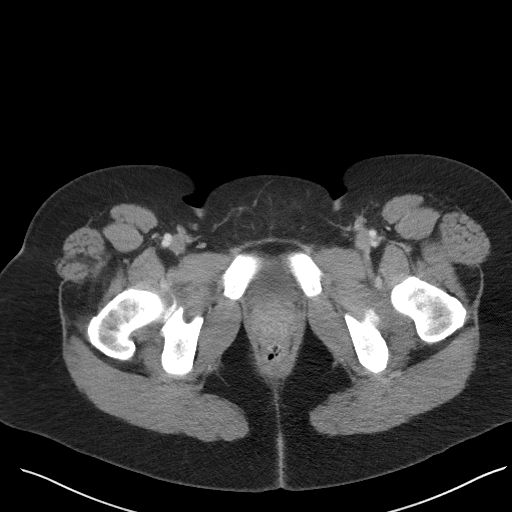
[im 19/94  soft-tissue]
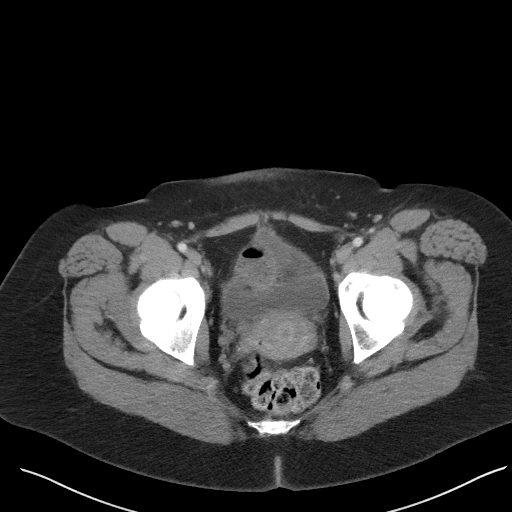
[im 27/94  soft-tissue]
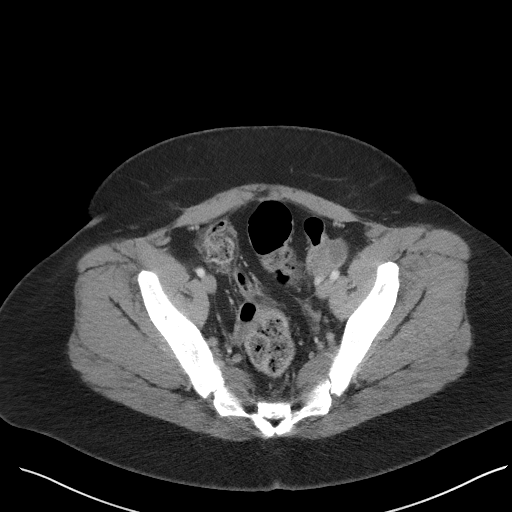
[im 34/94  soft-tissue]
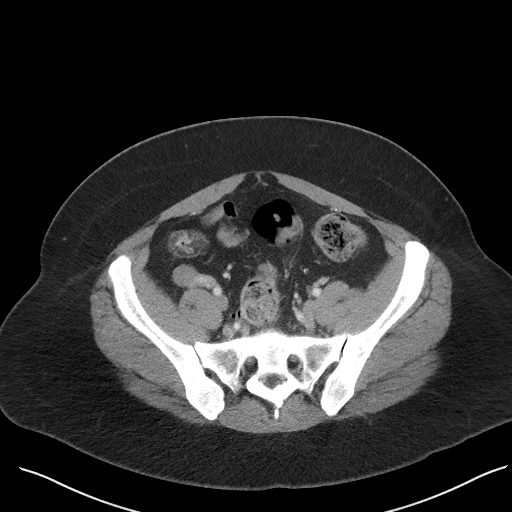
[im 41/94  soft-tissue]
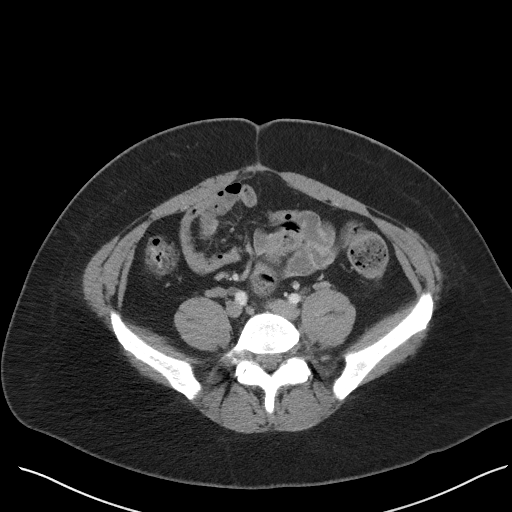
[im 49/94  soft-tissue]
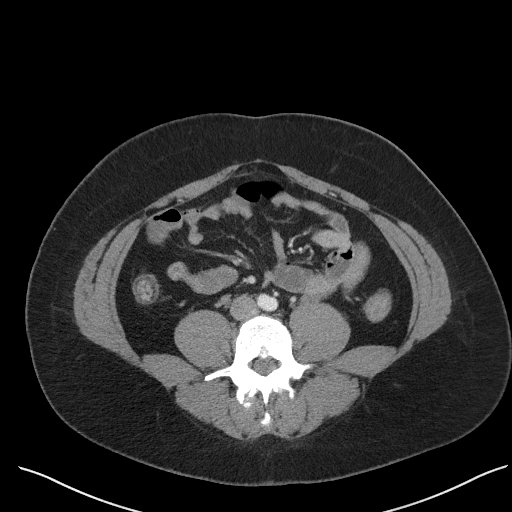
[im 53/94  soft-tissue]
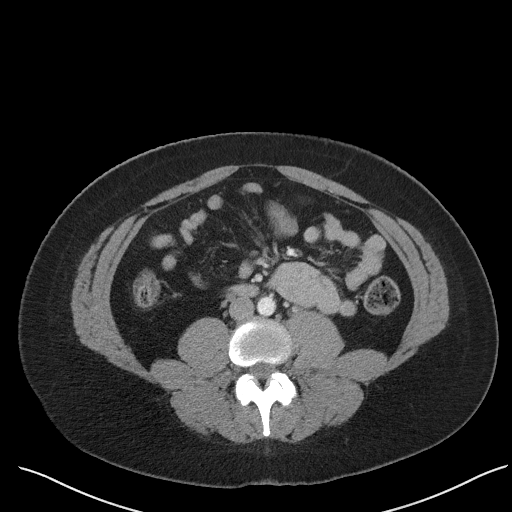
[im 60/94  soft-tissue]
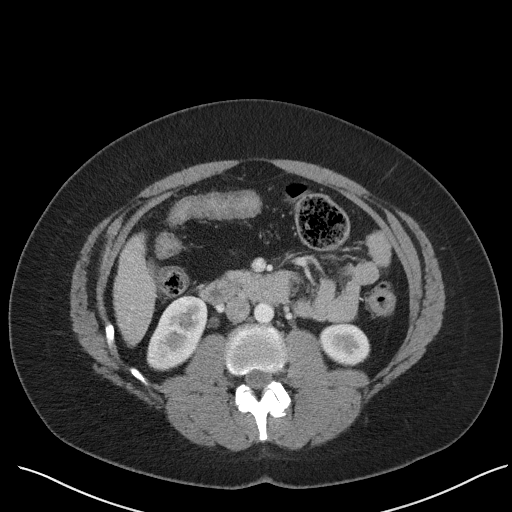
[im 60/94  bone]
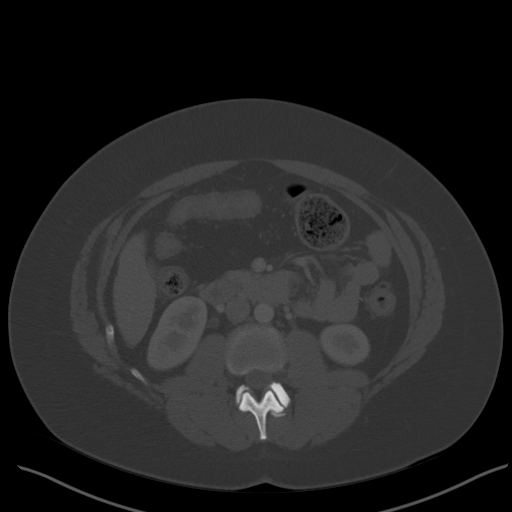
[im 67/94  soft-tissue]
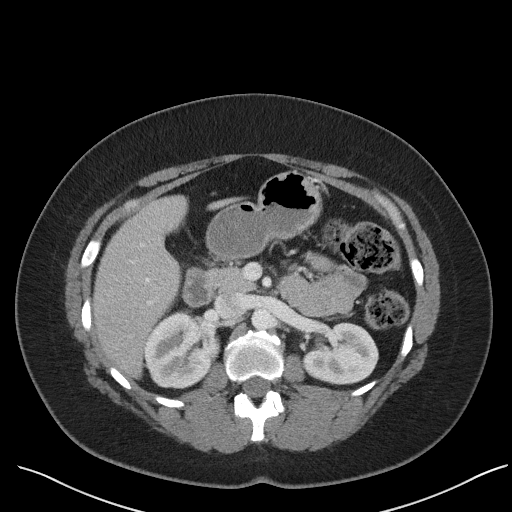
[im 75/94  soft-tissue]
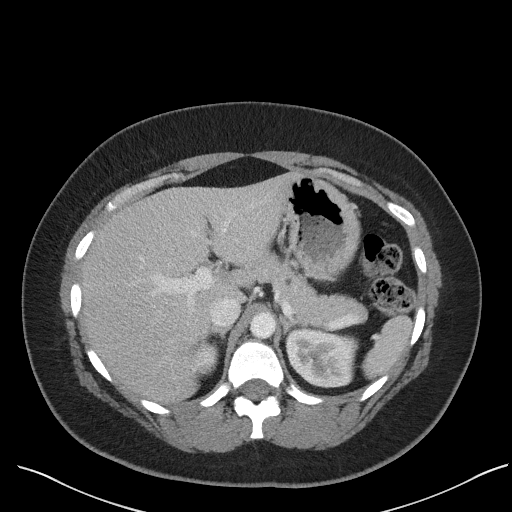
[im 82/94  soft-tissue]
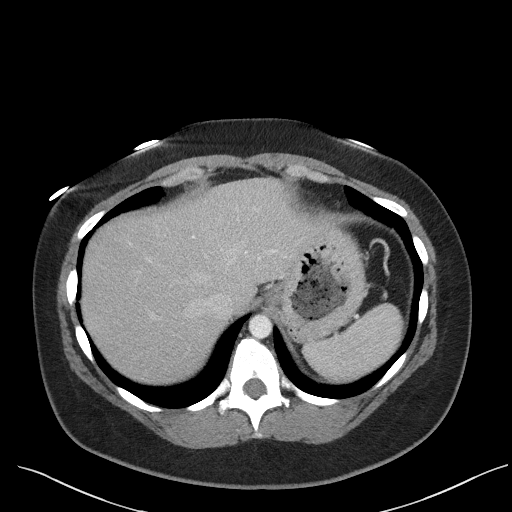
[im 90/94  soft-tissue]
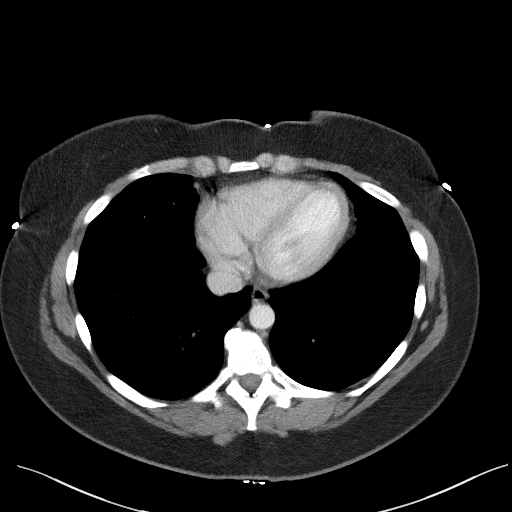

[Series 5: coronal st · coronal · 0.73mm/px · 3 of 95 slices shown]
[im 32/95  soft-tissue]
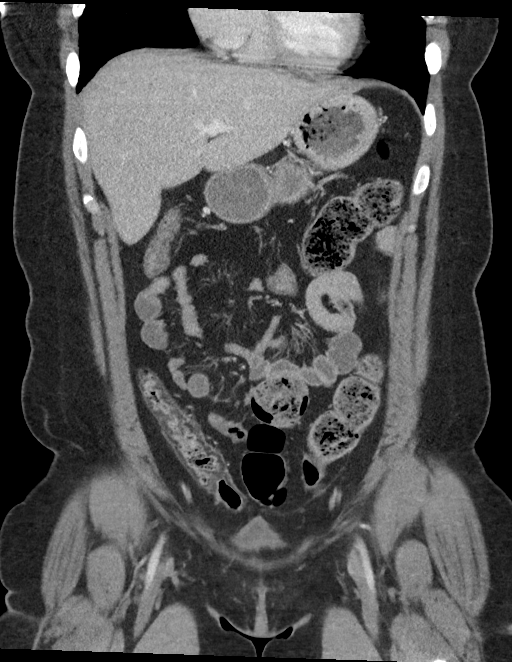
[im 42/95  soft-tissue]
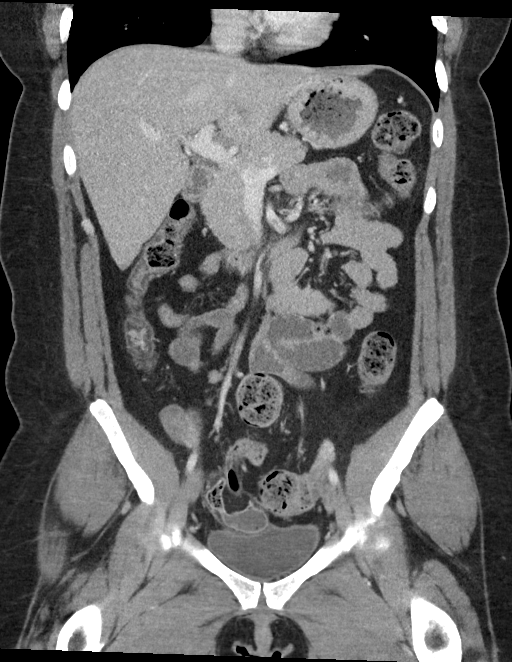
[im 53/95  soft-tissue]
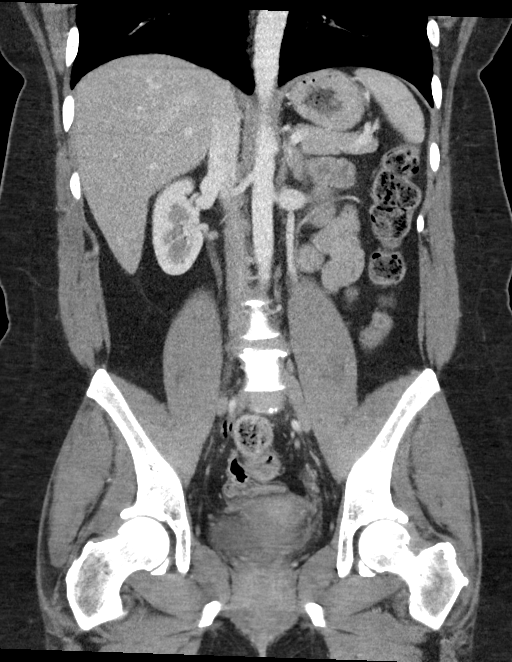

[16 of 46 positions shown; findings below may reference images not displayed]

FINDINGS: Lower chest: No acute abnormality.

Hepatobiliary: No focal liver abnormality is seen. Status post
cholecystectomy. No biliary dilatation.

Pancreas: Unremarkable. No pancreatic ductal dilatation or
surrounding inflammatory changes.

Spleen: Normal in size without focal abnormality.

Adrenals/Urinary Tract: Adrenal glands are unremarkable. Kidneys are
normal, without renal calculi, focal lesion, or hydronephrosis.
Bladder is unremarkable.

Stomach/Bowel: Stomach is within normal limits. There is submucosal
fatty infiltration involving the ascending colon and cecum which is
chronic and nonspecific. The appendix is normal. No evidence of
bowel wall thickening, distention, or inflammatory changes.

Vascular/Lymphatic: No significant vascular findings are present. No
enlarged abdominal or pelvic lymph nodes.

Reproductive: Status post hysterectomy. No adnexal masses.

Other: No abdominal wall hernia or abnormality. No abdominopelvic
ascites.

Musculoskeletal: No acute or significant osseous findings.
IMPRESSION: 1. No acute process in the abdomen or pelvis to explain the
patient's symptoms.
2. Submucosal fatty infiltration involving the ascending colon and
cecum is chronic and nonspecific, but may be related to body
habitus.
